# Patient Record
Sex: Female | Born: 1955
Health system: Southern US, Community
[De-identification: ages and names within clinical notes are randomized; demographics above are authoritative.]

## PROBLEM LIST (undated history)

## (undated) DIAGNOSIS — Q828 Other specified congenital malformations of skin: Secondary | ICD-10-CM

## (undated) DIAGNOSIS — L0390 Cellulitis, unspecified: Secondary | ICD-10-CM

## (undated) DIAGNOSIS — N840 Polyp of corpus uteri: Secondary | ICD-10-CM

## (undated) DIAGNOSIS — R9389 Abnormal findings on diagnostic imaging of other specified body structures: Principal | ICD-10-CM

## (undated) DIAGNOSIS — M810 Age-related osteoporosis without current pathological fracture: Secondary | ICD-10-CM

## (undated) DIAGNOSIS — J45909 Unspecified asthma, uncomplicated: Secondary | ICD-10-CM

## (undated) DIAGNOSIS — I10 Essential (primary) hypertension: Secondary | ICD-10-CM

## (undated) DIAGNOSIS — N950 Postmenopausal bleeding: Principal | ICD-10-CM

## (undated) HISTORY — DX: Cellulitis, unspecified: L03.90

## (undated) HISTORY — DX: Essential (primary) hypertension: I10

## (undated) HISTORY — DX: Polyp of corpus uteri: N84.0

## (undated) HISTORY — DX: Age-related osteoporosis without current pathological fracture: M81.0

## (undated) HISTORY — DX: Abnormal findings on diagnostic imaging of other specified body structures: R93.89

## (undated) HISTORY — PX: WISDOM TOOTH EXTRACTION: SHX21

## (undated) HISTORY — DX: Other specified congenital malformations of skin: Q82.8

## (undated) HISTORY — DX: Postmenopausal bleeding: N95.0

## (undated) HISTORY — DX: Unspecified asthma, uncomplicated: J45.909

---

## 2004-04-05 ENCOUNTER — Ambulatory Visit (HOSPITAL_COMMUNITY): Admission: RE | Admit: 2004-04-05 | Discharge: 2004-04-05 | Payer: Self-pay | Admitting: Pulmonary Disease

## 2005-11-25 ENCOUNTER — Emergency Department (HOSPITAL_COMMUNITY): Admission: EM | Admit: 2005-11-25 | Discharge: 2005-11-25 | Payer: Self-pay | Admitting: Emergency Medicine

## 2005-12-08 ENCOUNTER — Ambulatory Visit (HOSPITAL_COMMUNITY): Admission: RE | Admit: 2005-12-08 | Discharge: 2005-12-08 | Payer: Self-pay | Admitting: Pulmonary Disease

## 2007-02-27 ENCOUNTER — Ambulatory Visit (HOSPITAL_COMMUNITY): Admission: RE | Admit: 2007-02-27 | Discharge: 2007-02-27 | Payer: Self-pay | Admitting: Pulmonary Disease

## 2007-10-31 ENCOUNTER — Other Ambulatory Visit: Admission: RE | Admit: 2007-10-31 | Discharge: 2007-10-31 | Payer: Self-pay | Admitting: Obstetrics and Gynecology

## 2007-11-27 ENCOUNTER — Ambulatory Visit (HOSPITAL_COMMUNITY): Admission: RE | Admit: 2007-11-27 | Discharge: 2007-11-27 | Payer: Self-pay | Admitting: Pulmonary Disease

## 2007-11-28 LAB — HM MAMMOGRAPHY

## 2008-08-05 ENCOUNTER — Ambulatory Visit (HOSPITAL_COMMUNITY): Admission: RE | Admit: 2008-08-05 | Discharge: 2008-08-05 | Payer: Self-pay | Admitting: Pulmonary Disease

## 2009-12-16 ENCOUNTER — Ambulatory Visit (HOSPITAL_COMMUNITY)
Admission: RE | Admit: 2009-12-16 | Discharge: 2009-12-16 | Payer: Self-pay | Source: Home / Self Care | Admitting: Pulmonary Disease

## 2010-01-06 ENCOUNTER — Other Ambulatory Visit
Admission: RE | Admit: 2010-01-06 | Discharge: 2010-01-06 | Payer: Self-pay | Source: Home / Self Care | Admitting: Obstetrics and Gynecology

## 2010-01-12 ENCOUNTER — Ambulatory Visit (HOSPITAL_COMMUNITY): Admission: RE | Admit: 2010-01-12 | Discharge: 2010-01-12 | Payer: Self-pay | Admitting: Obstetrics & Gynecology

## 2011-12-14 ENCOUNTER — Other Ambulatory Visit (HOSPITAL_COMMUNITY)
Admission: RE | Admit: 2011-12-14 | Discharge: 2011-12-14 | Disposition: A | Discharge status: Home / Self Care | Payer: 59 | Source: Ambulatory Visit | Attending: Obstetrics and Gynecology | Admitting: Obstetrics and Gynecology

## 2011-12-14 ENCOUNTER — Other Ambulatory Visit: Payer: Self-pay | Admitting: Adult Health

## 2011-12-14 DIAGNOSIS — Z1151 Encounter for screening for human papillomavirus (HPV): Secondary | ICD-10-CM | POA: Insufficient documentation

## 2011-12-14 DIAGNOSIS — Z01419 Encounter for gynecological examination (general) (routine) without abnormal findings: Secondary | ICD-10-CM | POA: Insufficient documentation

## 2012-02-10 ENCOUNTER — Other Ambulatory Visit: Payer: Self-pay | Admitting: Obstetrics and Gynecology

## 2013-06-27 ENCOUNTER — Other Ambulatory Visit (HOSPITAL_COMMUNITY): Payer: Self-pay | Admitting: Pulmonary Disease

## 2013-06-27 DIAGNOSIS — R200 Anesthesia of skin: Secondary | ICD-10-CM

## 2013-06-28 ENCOUNTER — Ambulatory Visit (HOSPITAL_COMMUNITY)
Admission: RE | Admit: 2013-06-28 | Discharge: 2013-06-28 | Disposition: A | Discharge status: Home / Self Care | Payer: 59 | Source: Ambulatory Visit | Attending: Pulmonary Disease | Admitting: Pulmonary Disease

## 2013-06-28 DIAGNOSIS — R200 Anesthesia of skin: Secondary | ICD-10-CM

## 2013-06-28 DIAGNOSIS — R209 Unspecified disturbances of skin sensation: Secondary | ICD-10-CM | POA: Insufficient documentation

## 2013-07-05 ENCOUNTER — Other Ambulatory Visit (HOSPITAL_COMMUNITY): Payer: Self-pay | Admitting: Pulmonary Disease

## 2013-07-05 DIAGNOSIS — G35 Multiple sclerosis: Secondary | ICD-10-CM

## 2013-07-12 ENCOUNTER — Ambulatory Visit (HOSPITAL_COMMUNITY): Payer: 59

## 2014-11-18 ENCOUNTER — Encounter: Payer: Self-pay | Admitting: Adult Health

## 2014-11-18 ENCOUNTER — Ambulatory Visit (INDEPENDENT_AMBULATORY_CARE_PROVIDER_SITE_OTHER): Payer: 59 | Admitting: Adult Health

## 2014-11-18 VITALS — BP 150/90 | HR 76 | Ht 67.0 in | Wt 239.5 lb

## 2014-11-18 DIAGNOSIS — N950 Postmenopausal bleeding: Secondary | ICD-10-CM | POA: Diagnosis not present

## 2014-11-18 HISTORY — DX: Postmenopausal bleeding: N95.0

## 2014-11-18 NOTE — Patient Instructions (Signed)
Postmenopausal Bleeding Postmenopausal bleeding is any bleeding a woman has after she has entered into menopause. Menopause is the end of a woman's fertile years. After menopause, a woman no longer ovulates or has menstrual periods.  Postmenopausal bleeding can be caused by various things. Any type of postmenopausal bleeding, even if it appears to be a typical menstrual period, is concerning. This should be evaluated by your health care provider. Any treatment will depend on the cause of the bleeding. HOME CARE INSTRUCTIONS Monitor your condition for any changes. The following actions may help to alleviate any discomfort you are experiencing:  Avoid the use of tampons and douches as directed by your health care provider.  Change your pads frequently.  Get regular pelvic exams and Pap tests.  Keep all follow-up appointments for diagnostic tests as directed by your health care provider. SEEK MEDICAL CARE IF:   Your bleeding lasts more than 1 week.  You have abdominal pain.  You have bleeding with sexual intercourse. SEEK IMMEDIATE MEDICAL CARE IF:   You have a fever, chills, headache, dizziness, muscle aches, and bleeding.  You have severe pain with bleeding.  You are passing blood clots.  You have bleeding and need more than 1 pad an hour.  You feel faint. MAKE SURE YOU:  Understand these instructions.  Will watch your condition.  Will get help right away if you are not doing well or get worse. Document Released: 05/11/2005 Document Revised: 11/21/2012 Document Reviewed: 08/30/2012 [facility] Patient Information 2015 [place], [place]. This information is not intended to replace advice given to you by your health care provider. Make sure you discuss any questions you have with your health care provider. Return in 1 day for [place] 11/1 for pap and physical with me

## 2014-11-18 NOTE — Progress Notes (Signed)
Subjective:     Patient ID: Tonya Nelson, female   DOB: 08-12-1955, 59 y.o.   MRN: [id number]  HPI Tonya Nelson is a 59 year old white female, widowed in complaining of vaginal bleeding for 2 weeks, has clots no pain.Has not taken BP meds today.Has new sex partner and is seeing Dr Luan Pulling for work up of MS symptoms, has balance problems and legs hurt and hands and legs feel numb.Has increased urination and "MS hug" feeling.  Review of Systems Patient denies any headaches, hearing loss, fatigue, blurred vision, shortness of breath, chest pain, abdominal pain, problems with bowel movements, urination, or intercourse. No joint pain or mood swings.See HPI for positives.  Reviewed past medical,surgical, social and family history. Reviewed medications and allergies.     Objective:   Physical Exam BP 150/90 mmHg  Pulse 76  Ht 5\' 7"  (1.702 m)  Wt 239 lb 8 oz (108.636 kg)  BMI 37.50 kg/m2   Skin warm and dry.Pelvic: external genitalia is normal in appearance no lesions, vagina: period like blood,urethra has no lesions or masses noted, cervix:smooth and bulbous, uterus: normal size, shape and contour, non tender, no masses felt, adnexa: no masses or tenderness noted. Bladder is non tender and no masses felt. GC/CHL obtained.  Assessment:     PMB    Plan:    GC/CHL sent Return in 1 day for gyn [place] Return 11/1 for pap and physical an fasting labs Review handout on PMB

## 2014-11-19 ENCOUNTER — Telehealth: Payer: Self-pay | Admitting: Adult Health

## 2014-11-19 ENCOUNTER — Ambulatory Visit (INDEPENDENT_AMBULATORY_CARE_PROVIDER_SITE_OTHER): Payer: 59

## 2014-11-19 DIAGNOSIS — N950 Postmenopausal bleeding: Secondary | ICD-10-CM | POA: Diagnosis not present

## 2014-11-19 LAB — GC/CHLAMYDIA PROBE AMP
CHLAMYDIA, DNA PROBE: NEGATIVE
NEISSERIA GONORRHOEAE BY PCR: NEGATIVE

## 2014-11-19 MED ORDER — NAPROXEN SODIUM 550 MG PO TABS: 550.0000 mg | ORAL_TABLET | Freq: Two times a day (BID) | ORAL | Status: DC

## 2014-11-19 NOTE — Telephone Encounter (Signed)
Pt aware US shows 7.48 mm endometrium will get endo biopsy with Dr Glo Herring, and will rx anaprox ds for cramps, increase fluids

## 2014-11-19 NOTE — Progress Notes (Signed)
PELVIC US TA/TV:heterogenous, anteverted uterus w/a 2.2 x 1.7 x 1.8cm submucosal fundal fibroid that is distorting the fundal portion of the endometrium,thickened EEC 7.30mm,normal ov's bilat (mobile),no pain or free fluid

## 2014-11-28 ENCOUNTER — Ambulatory Visit (INDEPENDENT_AMBULATORY_CARE_PROVIDER_SITE_OTHER): Payer: 59 | Admitting: Obstetrics and Gynecology

## 2014-11-28 ENCOUNTER — Other Ambulatory Visit: Payer: Self-pay | Admitting: Obstetrics and Gynecology

## 2014-11-28 VITALS — BP 130/82 | Ht 67.0 in | Wt 238.0 lb

## 2014-11-28 DIAGNOSIS — N950 Postmenopausal bleeding: Secondary | ICD-10-CM | POA: Diagnosis not present

## 2014-11-28 DIAGNOSIS — N840 Polyp of corpus uteri: Secondary | ICD-10-CM | POA: Diagnosis not present

## 2014-11-28 NOTE — Progress Notes (Signed)
Patient ID: Tonya Nelson, female   DOB: [birth year], 59 y.o.   MRN: [id number] Pt here today for Endometrial biopsy.

## 2014-11-28 NOTE — Progress Notes (Signed)
Patient ID: Tonya Nelson, female   DOB: 11-07-1955, 59 y.o.   MRN: [id number]  Pt is having an endometrial biopsy for evaluation of post menopausal bleeding. Pt had an US done on 11/18/13. She states she has had an episode similar to this in the past  Endometrial Biopsy: Patient given informed consent, signed copy in the chart, time out was performed. Time out taken. The patient was placed in the lithotomy position and the cervix brought into view with sterile speculum.  Portio of cervix cleansed x 2 with betadine swabs.  A tenaculum was placed in the anterior lip of the cervix. The uterus was sounded for depth of 8 cm Milex uterine Explora 3 mm was introduced to into the uterus, suction created,  and an endometrial sample was obtained. All equipment was removed and accounted for.   The patient tolerated the procedure well.    Patient given post procedure instructions.  Followup: F/u results by phone, 1 week   By signing my name below, I, Erling Conte, attest that this documentation has been prepared under the direction and in the presence of Jonnie Kind, MD. Electronically Signed: Erling Conte, ED Scribe. 11/28/2014. 12:31 PM.  I personally performed the services described in this documentation, which was SCRIBED in my presence. The recorded information has been reviewed and considered accurate. It has been edited as necessary during review. Jonnie Kind, MD

## 2014-12-03 ENCOUNTER — Telehealth: Payer: Self-pay | Admitting: Obstetrics and Gynecology

## 2014-12-03 ENCOUNTER — Telehealth: Payer: Self-pay | Admitting: Adult Health

## 2014-12-03 NOTE — Telephone Encounter (Signed)
Left message no results back yet on endo biopsy

## 2014-12-03 NOTE — Telephone Encounter (Signed)
Pt stopped by the office and results were given to the pt.

## 2014-12-05 ENCOUNTER — Encounter: Payer: Self-pay | Admitting: Obstetrics and Gynecology

## 2014-12-05 ENCOUNTER — Ambulatory Visit (INDEPENDENT_AMBULATORY_CARE_PROVIDER_SITE_OTHER): Payer: Self-pay | Admitting: Obstetrics and Gynecology

## 2014-12-05 VITALS — BP 120/72 | Ht 67.0 in | Wt 243.0 lb

## 2014-12-05 DIAGNOSIS — N950 Postmenopausal bleeding: Secondary | ICD-10-CM

## 2014-12-05 NOTE — Progress Notes (Signed)
Patient ID: Tonya Nelson, female   DOB: 19-Mar-1955, 59 y.o.   MRN: [id number] Pt here today for results and to discuss what's next.

## 2014-12-05 NOTE — Progress Notes (Deleted)
°   [facility] Visit  Patient name: Tonya Nelson MRN [id number]  Date of birth: [birth year]  CC & HPI:  Tonya Nelson is a 59 y.o. female presenting today for discussion of endometrial biopsy results. She reports mild continued vaginal bleeding (spotting).  ROS:  A complete review of systems was obtained and all systems are negative except as noted in the HPI and PMH.    Pertinent History Reviewed:   Reviewed: Significant for postmenopausal bleeding Medical         Past Medical History  Diagnosis Date   Hypertension    Osteoporosis    PMB (postmenopausal bleeding) 11/18/2014                              Surgical Hx:    Past Surgical History  Procedure Laterality Date   Wisdom tooth extraction     Medications: Reviewed & Updated - see associated section                       Current outpatient prescriptions:    bisoprolol-hydrochlorothiazide (ZIAC) 10-6.25 MG tablet, Take 1 tablet by mouth daily., Disp: , Rfl:    naproxen sodium (ANAPROX) 550 MG tablet, Take 1 tablet (550 mg total) by mouth 2 (two) times daily with a meal., Disp: 30 tablet, Rfl: 1   triamcinolone cream (KENALOG) 0.1 %, APPLY TO AFFECTED AREAS UP TO 2 TIMES PER DAY; AVOID FACE, GROIN, UNDERARM, Disp: , Rfl: 0   Vitamin D, Ergocalciferol, (DRISDOL) 50000 UNITS CAPS capsule, Take 50,000 Units by mouth daily., Disp: , Rfl:    Social History: Reviewed -  reports that she has never smoked. She has never used smokeless tobacco.  Objective Findings:  Vitals: Blood pressure 120/72, height 5\' 7"  (1.702 m), weight 243 lb (110.224 kg).  Physical Examination:  Pt here for discussion only.    Assessment & Plan:   A:  1. Endometrial bx results still not available. Specimens were not picked up last week.   P:  1. Apologized to patient. Results available by phone next week. Pt verbalized understanding and agreed to plan.       By signing my name below, I, Stephania Fragmin, attest that this documentation  has been prepared under the direction and in the presence of Jonnie Kind, MD. Electronically Signed: Stephania Fragmin, ED Scribe. 12/05/2014. 11:32 AM.   I personally performed the services described in this documentation, which was SCRIBED in my presence. The recorded information has been reviewed and considered accurate. It has been edited as necessary during review. Jonnie Kind, MD   (scribe attestation statement)

## 2014-12-07 NOTE — Progress Notes (Signed)
Patient ID: Tonya Nelson, female   DOB: [birth year], 59 y.o.   MRN: [id number]   [facility] Visit  Patient name: Tonya Nelson MRN [id number]  Date of birth: [birth year]  CC & HPI:  Tonya Nelson is a 59 y.o. female presenting today for discussion of endometrial biopsy results. She reports mild continued vaginal bleeding (spotting).  ROS:  A complete review of systems was obtained and all systems are negative except as noted in the HPI and PMH.    Pertinent History Reviewed:   Reviewed: Significant for postmenopausal bleeding Medical         Past Medical History  Diagnosis Date  . Hypertension   . Osteoporosis   . PMB (postmenopausal bleeding) 11/18/2014                              Surgical Hx:    Past Surgical History  Procedure Laterality Date  . Wisdom tooth extraction     Medications: Reviewed & Updated - see associated section                       Current outpatient prescriptions:  .  bisoprolol-hydrochlorothiazide (ZIAC) 10-6.25 MG tablet, Take 1 tablet by mouth daily., Disp: , Rfl:  .  naproxen sodium (ANAPROX) 550 MG tablet, Take 1 tablet (550 mg total) by mouth 2 (two) times daily with a meal., Disp: 30 tablet, Rfl: 1 .  triamcinolone cream (KENALOG) 0.1 %, APPLY TO AFFECTED AREAS UP TO 2 TIMES PER DAY; AVOID FACE, GROIN, UNDERARM, Disp: , Rfl: 0 .  Vitamin D, Ergocalciferol, (DRISDOL) 50000 UNITS CAPS capsule, Take 50,000 Units by mouth daily., Disp: , Rfl:    Social History: Reviewed -  reports that she has never smoked. She has never used smokeless tobacco.  Objective Findings:  Vitals: Blood pressure 120/72, height 5\' 7"  (1.702 m), weight 243 lb (110.224 kg).  Physical Examination:  Pt here for discussion only.    Assessment & Plan:   A:  1. Endometrial bx results still not available. Specimens were not picked up last week.   P:  1. Apologized to patient. Results available by phone next week. Pt verbalized understanding and agreed to plan.        By signing my name below, I, Stephania Fragmin, attest that this documentation has been prepared under the direction and in the presence of Jonnie Kind, MD. Electronically Signed: Stephania Fragmin, ED Scribe. 12/05/2014. 11:32 AM.   I personally performed the services described in this documentation, which was SCRIBED in my presence. The recorded information has been reviewed and considered accurate. It has been edited as necessary during review. Jonnie Kind, MD   (scribe attestation statement)

## 2014-12-08 ENCOUNTER — Telehealth: Payer: Self-pay | Admitting: Obstetrics and Gynecology

## 2014-12-08 NOTE — Telephone Encounter (Signed)
Pt aware of biopsy results  

## 2014-12-16 ENCOUNTER — Ambulatory Visit: Payer: 59 | Admitting: Adult Health

## 2014-12-16 DIAGNOSIS — N840 Polyp of corpus uteri: Secondary | ICD-10-CM

## 2014-12-16 HISTORY — DX: Polyp of corpus uteri: N84.0

## 2014-12-17 ENCOUNTER — Ambulatory Visit: Payer: Self-pay | Admitting: Adult Health

## 2014-12-25 ENCOUNTER — Ambulatory Visit (INDEPENDENT_AMBULATORY_CARE_PROVIDER_SITE_OTHER): Payer: 59 | Admitting: Adult Health

## 2014-12-25 ENCOUNTER — Other Ambulatory Visit (HOSPITAL_COMMUNITY)
Admission: RE | Admit: 2014-12-25 | Discharge: 2014-12-25 | Disposition: A | Discharge status: Home / Self Care | Payer: Self-pay | Source: Ambulatory Visit | Attending: Adult Health | Admitting: Adult Health

## 2014-12-25 ENCOUNTER — Encounter: Payer: Self-pay | Admitting: Adult Health

## 2014-12-25 VITALS — BP 128/90 | HR 64 | Ht 67.0 in | Wt 240.5 lb

## 2014-12-25 DIAGNOSIS — Z1151 Encounter for screening for human papillomavirus (HPV): Secondary | ICD-10-CM | POA: Insufficient documentation

## 2014-12-25 DIAGNOSIS — Z01419 Encounter for gynecological examination (general) (routine) without abnormal findings: Secondary | ICD-10-CM | POA: Insufficient documentation

## 2014-12-25 DIAGNOSIS — Z1212 Encounter for screening for malignant neoplasm of rectum: Secondary | ICD-10-CM

## 2014-12-25 LAB — HEMOCCULT GUIAC POC 1CARD (OFFICE): FECAL OCCULT BLD: NEGATIVE

## 2014-12-25 NOTE — Progress Notes (Signed)
Patient ID: Tonya Nelson, female   DOB: 1955/10/23, 59 y.o.   MRN: [id number] History of Present Illness: Tonya Nelson is a 59 year old white female,widowed,in for a well woman gyn exam and pap.She has had PMB recently and had endometrial biopsy that showed benign polyp. She says the bleeding has stopped. PCP is Dr Luan Pulling.  Current Medications, Allergies, Past Medical History, Past Surgical History, Family History and Social History were reviewed in [employer] record.     Review of Systems: Patient denies any headaches, hearing loss, fatigue, blurred vision, shortness of breath, chest pain, abdominal pain, problems with bowel movements, urination, or intercourse. No joint pain or mood swings.See HPI for positives.    Physical Exam:BP 128/90 mmHg  Pulse 64  Ht 5\' 7"  (1.702 m)  Wt 240 lb 8 oz (109.09 kg)  BMI 37.66 kg/m2 General:  Well developed, well nourished, no acute distress Skin:  Warm and dry Neck:  Midline trachea, normal thyroid, good ROM, no lymphadenopathy Lungs; Clear to auscultation bilaterally Breast:  No dominant palpable mass, retraction, or nipple discharge Cardiovascular: Regular rate and rhythm Abdomen:  Soft, non tender, no hepatosplenomegaly,has dry patch of skin on right side of waist Pelvic:  External genitalia is normal in appearance, no lesions.  The vagina has decreased color, moisture and rugae. Urethra has no lesions or masses. The cervix is smooth,pap with HPV performed.  Uterus is felt to be normal size, shape, and contour.  No adnexal masses or tenderness noted.Bladder is non tender, no masses felt. Rectal: Good sphincter tone, no polyps,  hemorrhoids felt.  Hemoccult negative. Extremities/musculoskeletal:  No swelling or varicosities noted, no clubbing or cyanosis Psych:  No mood changes, alert and cooperative,seems happy   Impression: Well woman gyn exam with pap   Plan: If has any more bleeding call Physical in 1 year Mammogram  now(past due) and yearly Colonoscopy advised Declines flu shot  Check CBC,CMP,TSH and lipids Use kenalog on dry skin patch 2-3 x daily as needed,she has some at home

## 2014-12-25 NOTE — Patient Instructions (Signed)
Physical in  1 year Mammogram now and yearly  [phone] Colonoscopy advised

## 2014-12-29 LAB — CYTOLOGY - PAP

## 2014-12-30 ENCOUNTER — Telehealth: Payer: Self-pay | Admitting: Adult Health

## 2014-12-30 NOTE — Telephone Encounter (Signed)
Pt aware of pap,did not get labs due to insurance problem

## 2015-01-14 LAB — CBC
HEMOGLOBIN: 13.8 g/dL (ref 11.1–15.9)
Hematocrit: 41.3 % (ref 34.0–46.6)
MCH: 28.8 pg (ref 26.6–33.0)
MCHC: 33.4 g/dL (ref 31.5–35.7)
MCV: 86 fL (ref 79–97)
PLATELETS: 224 10*3/uL (ref 150–379)
RBC: 4.79 x10E6/uL (ref 3.77–5.28)
RDW: 13.8 % (ref 12.3–15.4)
WBC: 6.8 10*3/uL (ref 3.4–10.8)

## 2015-01-14 LAB — COMPREHENSIVE METABOLIC PANEL
ALBUMIN: 4 g/dL (ref 3.6–4.8)
ALK PHOS: 40 IU/L (ref 39–117)
ALT: 17 IU/L (ref 0–32)
AST: 17 IU/L (ref 0–40)
Albumin/Globulin Ratio: 1.4 (ref 1.1–2.5)
BILIRUBIN TOTAL: 0.5 mg/dL (ref 0.0–1.2)
BUN/Creatinine Ratio: 16 (ref 11–26)
BUN: 13 mg/dL (ref 8–27)
CHLORIDE: 102 mmol/L (ref 97–106)
CO2: 26 mmol/L (ref 18–29)
CREATININE: 0.79 mg/dL (ref 0.57–1.00)
Calcium: 9.7 mg/dL (ref 8.7–10.3)
GFR calc Af Amer: 94 mL/min/{1.73_m2} (ref >59)
GFR calc non Af Amer: 82 mL/min/{1.73_m2} (ref >59)
GLUCOSE: 100 mg/dL — AB (ref 65–99)
Globulin, Total: 2.9 g/dL (ref 1.5–4.5)
Potassium: 4.4 mmol/L (ref 3.5–5.2)
Sodium: 143 mmol/L (ref 136–144)
TOTAL PROTEIN: 6.9 g/dL (ref 6.0–8.5)

## 2015-01-14 LAB — LIPID PANEL
Chol/HDL Ratio: 3.6 ratio units (ref 0.0–4.4)
Cholesterol, Total: 159 mg/dL (ref 100–199)
HDL: 44 mg/dL (ref >39)
LDL Calculated: 93 mg/dL (ref 0–99)
TRIGLYCERIDES: 109 mg/dL (ref 0–149)
VLDL CHOLESTEROL CAL: 22 mg/dL (ref 5–40)

## 2015-01-14 LAB — TSH: TSH: 0.985 u[IU]/mL (ref 0.450–4.500)

## 2015-01-16 ENCOUNTER — Telehealth: Payer: Self-pay | Admitting: Adult Health

## 2015-01-16 NOTE — Telephone Encounter (Signed)
Pt aware labs good 

## 2015-09-18 DIAGNOSIS — R609 Edema, unspecified: Secondary | ICD-10-CM | POA: Diagnosis not present

## 2015-09-29 ENCOUNTER — Other Ambulatory Visit (HOSPITAL_COMMUNITY): Payer: Self-pay | Admitting: Pulmonary Disease

## 2015-09-29 DIAGNOSIS — R600 Localized edema: Secondary | ICD-10-CM | POA: Diagnosis not present

## 2015-09-29 DIAGNOSIS — M7989 Other specified soft tissue disorders: Secondary | ICD-10-CM

## 2015-09-29 DIAGNOSIS — L03032 Cellulitis of left toe: Secondary | ICD-10-CM | POA: Diagnosis not present

## 2015-09-29 DIAGNOSIS — J45909 Unspecified asthma, uncomplicated: Secondary | ICD-10-CM | POA: Diagnosis not present

## 2015-09-29 DIAGNOSIS — R609 Edema, unspecified: Secondary | ICD-10-CM

## 2015-09-29 DIAGNOSIS — I10 Essential (primary) hypertension: Secondary | ICD-10-CM | POA: Diagnosis not present

## 2015-10-05 DIAGNOSIS — J45909 Unspecified asthma, uncomplicated: Secondary | ICD-10-CM | POA: Diagnosis not present

## 2015-10-05 DIAGNOSIS — R600 Localized edema: Secondary | ICD-10-CM | POA: Diagnosis not present

## 2015-10-05 DIAGNOSIS — L03032 Cellulitis of left toe: Secondary | ICD-10-CM | POA: Diagnosis not present

## 2015-10-05 DIAGNOSIS — I10 Essential (primary) hypertension: Secondary | ICD-10-CM | POA: Diagnosis not present

## 2015-10-05 DIAGNOSIS — R7309 Other abnormal glucose: Secondary | ICD-10-CM | POA: Diagnosis not present

## 2015-10-05 LAB — COMPREHENSIVE METABOLIC PANEL
Albumin: 3.9 (ref 3.5–5.0)
Calcium: 9.2 (ref 8.7–10.7)

## 2015-10-05 LAB — CBC AND DIFFERENTIAL
HCT: 37 (ref 36–46)
Hemoglobin: 12.2 (ref 12.0–16.0)
Neutrophils Absolute: 3416
Platelets: 192 (ref 150–399)
WBC: 6.1

## 2015-10-05 LAB — BASIC METABOLIC PANEL
BUN: 12 (ref 4–21)
CO2: 29 — AB (ref 13–22)
Chloride: 103 (ref 99–108)
Creatinine: 0.8 (ref <=1.1)
Glucose: 102
Potassium: 3.7 (ref 3.4–5.3)
Sodium: 141 (ref 137–147)

## 2015-10-05 LAB — HEPATIC FUNCTION PANEL
ALT: 28 (ref 7–35)
AST: 31 (ref 13–35)
Alkaline Phosphatase: 35 (ref 25–125)

## 2015-10-05 LAB — CBC: RBC: 4.28 (ref 3.87–5.11)

## 2015-10-07 DIAGNOSIS — L03119 Cellulitis of unspecified part of limb: Secondary | ICD-10-CM | POA: Diagnosis not present

## 2015-10-07 LAB — TSH: TSH: 1.36 (ref <=5.90)

## 2015-10-07 LAB — HEMOGLOBIN A1C: Hemoglobin A1C: 5.3

## 2015-10-08 ENCOUNTER — Ambulatory Visit (HOSPITAL_COMMUNITY)
Admission: RE | Admit: 2015-10-08 | Discharge: 2015-10-08 | Disposition: A | Discharge status: Home / Self Care | Payer: BLUE CROSS/BLUE SHIELD | Source: Ambulatory Visit | Attending: Vascular Surgery | Admitting: Vascular Surgery

## 2015-10-08 ENCOUNTER — Ambulatory Visit (HOSPITAL_COMMUNITY)
Admission: RE | Admit: 2015-10-08 | Discharge: 2015-10-08 | Disposition: A | Discharge status: Home / Self Care | Payer: BLUE CROSS/BLUE SHIELD | Source: Ambulatory Visit | Attending: Pulmonary Disease | Admitting: Pulmonary Disease

## 2015-10-08 ENCOUNTER — Encounter (HOSPITAL_COMMUNITY): Payer: Self-pay

## 2015-10-08 DIAGNOSIS — I517 Cardiomegaly: Secondary | ICD-10-CM | POA: Insufficient documentation

## 2015-10-08 DIAGNOSIS — I059 Rheumatic mitral valve disease, unspecified: Secondary | ICD-10-CM | POA: Diagnosis not present

## 2015-10-08 DIAGNOSIS — M7989 Other specified soft tissue disorders: Secondary | ICD-10-CM

## 2015-10-08 DIAGNOSIS — R609 Edema, unspecified: Secondary | ICD-10-CM | POA: Diagnosis not present

## 2015-10-08 LAB — ECHOCARDIOGRAM COMPLETE
CHL CUP DOP CALC LVOT VTI: 28.3 cm
CHL CUP RV SYS PRESS: 31 mmHg
CHL CUP STROKE VOLUME: 45 mL
EERAT: 9.88
EWDT: 240 ms
FS: 37 % (ref 28–44)
IVS/LV PW RATIO, ED: 0.92
LA ID, A-P, ES: 37 mm
LADIAMINDEX: 1.6 cm/m2
LAVOL: 49.6 mL
LAVOLA4C: 45.9 mL
LAVOLIN: 21.4 mL/m2
LEFT ATRIUM END SYS DIAM: 37 mm
LV E/e' medial: 9.88
LV PW d: 11.1 mm — AB (ref 0.6–1.1)
LV SIMPSON'S DISK: 69
LV TDI E'MEDIAL: 5.87
LV dias vol index: 28 mL/m2
LV dias vol: 65 mL (ref 46–106)
LV e' LATERAL: 8.49 cm/s
LVEEAVG: 9.88
LVOT area: 2.84 cm2
LVOT peak grad rest: 5 mmHg
LVOT peak vel: 110 cm/s
LVOTD: 19 mm
LVOTSV: 80 mL
LVSYSVOL: 20 mL (ref 14–42)
LVSYSVOLIN: 9 mL/m2
Lateral S' vel: 12.3 cm/s
MV Dec: 240
MV Peak grad: 3 mmHg
MV pk A vel: 131 m/s
MVPKEVEL: 83.9 m/s
RV TAPSE: 31.7 mm
Reg peak vel: 263 cm/s
TDI e' lateral: 8.49
TRMAXVEL: 263 cm/s

## 2015-10-08 NOTE — Progress Notes (Signed)
*  PRELIMINARY RESULTS* Echocardiogram 2D Echocardiogram has been performed.  Tonya Nelson 10/08/2015, 8:59 AM

## 2015-10-08 NOTE — Progress Notes (Signed)
*  PRELIMINARY RESULTS* Echocardiogram 2D Echocardiogram has been performed.  Tonya Nelson 10/08/2015, 9:17 AM

## 2015-10-15 DIAGNOSIS — J45909 Unspecified asthma, uncomplicated: Secondary | ICD-10-CM | POA: Diagnosis not present

## 2015-10-15 DIAGNOSIS — L03032 Cellulitis of left toe: Secondary | ICD-10-CM | POA: Diagnosis not present

## 2015-10-15 DIAGNOSIS — R600 Localized edema: Secondary | ICD-10-CM | POA: Diagnosis not present

## 2015-10-15 DIAGNOSIS — I10 Essential (primary) hypertension: Secondary | ICD-10-CM | POA: Diagnosis not present

## 2015-10-22 ENCOUNTER — Ambulatory Visit (INDEPENDENT_AMBULATORY_CARE_PROVIDER_SITE_OTHER): Payer: BLUE CROSS/BLUE SHIELD | Admitting: Cardiology

## 2015-10-22 ENCOUNTER — Encounter: Payer: Self-pay | Admitting: Cardiology

## 2015-10-22 VITALS — BP 144/74 | HR 79 | Ht 67.0 in | Wt 245.0 lb

## 2015-10-22 DIAGNOSIS — I5189 Other ill-defined heart diseases: Secondary | ICD-10-CM

## 2015-10-22 DIAGNOSIS — M7989 Other specified soft tissue disorders: Secondary | ICD-10-CM | POA: Diagnosis not present

## 2015-10-22 DIAGNOSIS — I890 Lymphedema, not elsewhere classified: Secondary | ICD-10-CM | POA: Diagnosis not present

## 2015-10-22 DIAGNOSIS — I10 Essential (primary) hypertension: Secondary | ICD-10-CM

## 2015-10-22 DIAGNOSIS — I519 Heart disease, unspecified: Secondary | ICD-10-CM | POA: Diagnosis not present

## 2015-10-22 MED ORDER — FUROSEMIDE 80 MG PO TABS: 80.0000 mg | ORAL_TABLET | Freq: Every day | ORAL | 3 refills | Status: DC

## 2015-10-22 MED ORDER — POTASSIUM CHLORIDE CRYS ER 20 MEQ PO TBCR: 40.0000 meq | EXTENDED_RELEASE_TABLET | Freq: Every day | ORAL | 3 refills | Status: DC

## 2015-10-22 NOTE — Progress Notes (Signed)
Cardiology Office Note  Date: 10/22/2015   ID: Tonya Nelson, DOB Sep 26, 1955, MRN [id number]  PCP: Alonza Bogus, MD  Consulting Cardiologist: Rozann Lesches, MD   Chief Complaint  Patient presents with  . Leg Swelling    History of Present Illness: Tonya Nelson is a 60 y.o. female referred for cardiology consultation by Dr. Luan Pulling. She is referred with leg edema that has been present over the last several weeks, also recent cellulitis of her left fifth toe region, status post a few courses of antibiotics at this point. She states that the cellulitis seems to be getting better, she also had a place on her left arm as well. She has leg edema at baseline, but reports that it has been worse during this time. She is not reporting any chest pain, orthopnea, or PND.  I reviewed her echocardiogram, report is outlined below. She has preserved systolic function and only mild diastolic dysfunction. Leg edema seems to be out of proportion to this. Blood pressure has also been recently well-controlled over time by report and she is on Ziac. I reviewed her ECG today which shows normal sinus rhythm.  She has been taking Lasix 40 mg daily with potassium supplements. This has not led to substantial improvement in leg edema as yet. She states that she was on a diuretic in the past as well.  Past Medical History:  Diagnosis Date  . Cellulitis    in toe on left foot  . Endometrial polyp 12/2014  . Essential hypertension   . Osteoporosis   . PMB (postmenopausal bleeding) 11/18/2014    Past Surgical History:  Procedure Laterality Date  . WISDOM TOOTH EXTRACTION      Current Outpatient Prescriptions  Medication Sig Dispense Refill  . bisoprolol-hydrochlorothiazide (ZIAC) 10-6.25 MG tablet Take 1 tablet by mouth daily.    Marland Kitchen doxycycline (VIBRAMYCIN) 100 MG capsule Take 100 mg by mouth 2 (two) times daily.  0  . naproxen sodium (ANAPROX) 550 MG tablet Take 1 tablet (550 mg total) by mouth 2  (two) times daily with a meal. (Patient taking differently: Take 550 mg by mouth daily. ) 30 tablet 1  . triamcinolone cream (KENALOG) 0.1 % APPLY TO AFFECTED AREAS UP TO 2 TIMES PER DAY; AVOID FACE, GROIN, UNDERARM  0  . Vitamin D, Ergocalciferol, (DRISDOL) 50000 UNITS CAPS capsule Take 50,000 Units by mouth daily.     No current facility-administered medications for this visit.    Allergies:  Penicillins   Social History: The patient  reports that she has never smoked. She has never used smokeless tobacco. She reports that she does not drink alcohol or use drugs.   Family History: The patient's family history includes Bone cancer in her father; Diabetes in her maternal grandmother; Glaucoma in her maternal grandfather; Osteoporosis in her mother; Parkinson's disease in her brother; Stomach cancer in her mother.   ROS:  Please see the history of present illness. Otherwise, complete review of systems is positive for bilateral leg edema.  All other systems are reviewed and negative.   Physical Exam: VS:  BP (!) 144/74   Pulse 79   Ht 5\' 7"  (1.702 m)   Wt 245 lb (111.1 kg)   SpO2 98%   BMI 38.37 kg/m , BMI Body mass index is 38.37 kg/m.  Wt Readings from Last 3 Encounters:  10/22/15 245 lb (111.1 kg)  12/25/14 240 lb 8 oz (109.1 kg)  12/05/14 243 lb (110.2 kg)  General: Obese woman, appears comfortable at rest. HEENT: Conjunctiva and lids normal, oropharynx clear. Neck: Supple, no elevated JVP or carotid bruits, no thyromegaly. Lungs: Clear to auscultation, nonlabored breathing at rest. Cardiac: Regular rate and rhythm, no S3 or significant systolic murmur, no pericardial rub. Abdomen: Soft, nontender, bowel sounds present, no guarding or rebound. Extremities: Moderate bilateral lower leg edema and lymphedema, distal pulses 1-2+. Skin: Warm and dry. Mall area of erythema left fifth toe but nontender. Musculoskeletal: No kyphosis. Neuropsychiatric: Alert and oriented x3, affect  grossly appropriate.  ECG: No old tracing for comparison.  Recent Labwork: 01/13/2015: ALT 17; AST 17; BUN 13; Creatinine, Ser 0.79; Platelets 224; Potassium 4.4; Sodium 143; TSH 0.985     Component Value Date/Time   CHOL 159 01/13/2015 0907   TRIG 109 01/13/2015 0907   HDL 44 01/13/2015 0907   CHOLHDL 3.6 01/13/2015 0907   LDLCALC 93 01/13/2015 0907    Other Studies Reviewed Today:  Echocardiogram 10/08/2015: Study Conclusions  - Left ventricle: The cavity size was mildly dilated. Wall   thickness was normal. Systolic function was normal. The estimated   ejection fraction was in the range of 55% to 60%. Wall motion was   normal; there were no regional wall motion abnormalities. Doppler   parameters are consistent with abnormal left ventricular   relaxation (grade 1 diastolic dysfunction). - Mitral valve: Calcified annulus. - Atrial septum: No defect or patent foramen ovale was identified. - Pulmonary arteries: PA peak pressure: 31 mm Hg (S).  Lower extremity venous Dopplers 10/08/2015: FINDINGS: There is complete compressibility of the bilateral common femoral, femoral, and popliteal veins. Doppler analysis demonstrates respiratory phasicity and augmentation of flow with calf compression. No obvious superficial vein or calf vein thrombosis.  IMPRESSION: No evidence of DVT in the lower extremities.  Assessment and Plan:  1. Bilateral leg edema and lymphedema on examination. Seems to be out of proportion to her recent echocardiogram which shows preserved LVEF and only mild diastolic dysfunction. She has been on Ziac at baseline. Agree with use of Lasix, I asked her to double the dose along with her potassium supplements over the next week to see if this was more effective. If so she should check back with Dr. Luan Pulling regarding arranging a higher standing diuretic dose. If she is able to get her leg edema back to baseline, might be a good situation to use compression  stockings.  2. Essential hypertension, on Ziac.  3. Recent left fifth toe cellulitis, improving on antibiotics. No definite history of PAD or claudication.  Current medicines were reviewed with the patient today.   Orders Placed This Encounter  Procedures  . EKG 12-Lead    Disposition: Follow-up with Dr. Luan Pulling, we can see her back if needed.  Signed, Satira Sark, MD, [facility] 10/22/2015 11:39 AM    [facility]. [address] [place], [place] [address] Phone: [phone]; Fax: [phone]

## 2015-10-22 NOTE — Patient Instructions (Addendum)
Your physician recommends that you schedule a follow-up appointment in: as needed    INCREASE Lasix to 80 mg daily   INCREASE Potassium to 40 meq daily   Follow up with Dr Luan Pulling regarding your medication doses of lasix and potasium regarding your leg swelling       Thank you for choosing [facility] !

## 2015-12-15 DIAGNOSIS — J45909 Unspecified asthma, uncomplicated: Secondary | ICD-10-CM | POA: Diagnosis not present

## 2015-12-15 DIAGNOSIS — R600 Localized edema: Secondary | ICD-10-CM | POA: Diagnosis not present

## 2015-12-15 DIAGNOSIS — L03032 Cellulitis of left toe: Secondary | ICD-10-CM | POA: Diagnosis not present

## 2015-12-15 DIAGNOSIS — I10 Essential (primary) hypertension: Secondary | ICD-10-CM | POA: Diagnosis not present

## 2016-06-20 DIAGNOSIS — M79674 Pain in right toe(s): Secondary | ICD-10-CM | POA: Diagnosis not present

## 2016-06-20 DIAGNOSIS — M79675 Pain in left toe(s): Secondary | ICD-10-CM | POA: Diagnosis not present

## 2016-06-20 DIAGNOSIS — B351 Tinea unguium: Secondary | ICD-10-CM | POA: Diagnosis not present

## 2016-12-28 DIAGNOSIS — M79672 Pain in left foot: Secondary | ICD-10-CM | POA: Diagnosis not present

## 2016-12-28 DIAGNOSIS — M79671 Pain in right foot: Secondary | ICD-10-CM | POA: Diagnosis not present

## 2016-12-28 DIAGNOSIS — B351 Tinea unguium: Secondary | ICD-10-CM | POA: Diagnosis not present

## 2017-01-04 DIAGNOSIS — M79672 Pain in left foot: Secondary | ICD-10-CM | POA: Diagnosis not present

## 2017-01-04 DIAGNOSIS — I10 Essential (primary) hypertension: Secondary | ICD-10-CM | POA: Diagnosis not present

## 2017-01-04 DIAGNOSIS — K0510 Chronic gingivitis, plaque induced: Secondary | ICD-10-CM | POA: Diagnosis not present

## 2017-03-22 DIAGNOSIS — Q828 Other specified congenital malformations of skin: Secondary | ICD-10-CM | POA: Diagnosis not present

## 2017-03-22 DIAGNOSIS — M79672 Pain in left foot: Secondary | ICD-10-CM | POA: Diagnosis not present

## 2017-05-13 LAB — GLUCOSE, POCT (MANUAL RESULT ENTRY): POC GLUCOSE: 122 mg/dL — AB (ref 70–99)

## 2017-05-22 ENCOUNTER — Ambulatory Visit (INDEPENDENT_AMBULATORY_CARE_PROVIDER_SITE_OTHER): Payer: BLUE CROSS/BLUE SHIELD | Admitting: Adult Health

## 2017-05-22 ENCOUNTER — Encounter: Payer: Self-pay | Admitting: Adult Health

## 2017-05-22 VITALS — BP 126/70 | HR 76 | Ht 67.0 in | Wt 245.0 lb

## 2017-05-22 DIAGNOSIS — N950 Postmenopausal bleeding: Secondary | ICD-10-CM | POA: Diagnosis not present

## 2017-05-22 NOTE — Progress Notes (Signed)
Subjective:     Patient ID: Tonya Nelson, female   DOB: February 05, 1956, 62 y.o.   MRN: [id number]  HPI Tonya Nelson is a 62 year old white female, widowed, PM, in complaining of having vaginal bleeding for 1 week the end of March,just like a period, has stopped.And is sees Dr Tonya Nelson for nail care and has pain when walks, has porokeratosis. She had episode of PMB in 2016 and biopsy showed benign polyp.  PCP is Dr Tonya Nelson.  Review of Systems Had bleeding for 1 week the ned of March like a period, has stopped Feet hurt when walks  Reviewed past medical,surgical, social and family history. Reviewed medications and allergies.     Objective:   Physical Exam BP 126/70 (BP Location: Left Arm, Patient Position: Sitting, Cuff Size: Large)   Pulse 76   Ht 5\' 7"  (1.702 m)   Wt 245 lb (111.1 kg)   BMI 38.37 kg/m   Skin warm and dry.Pelvic: external genitalia is normal in appearance no lesions, vagina: pale pink,urethra has no lesions or masses noted, cervix:smooth, uterus: normal size, shape and contour, non tender, no masses felt, adnexa: no masses or tenderness noted. Bladder is non tender and no masses felt. Feet dry, with thickened skin in few places.   Will get [place] to assess uterus.  Assessment:     1. PMB (postmenopausal bleeding)       Plan:     Return in 1 day for GYN [place] Review handout on PMB I gave her a handout from up to date on porokeratosis, she has on her left foot  Could try mole skin

## 2017-05-22 NOTE — Patient Instructions (Signed)

## 2017-05-23 ENCOUNTER — Telehealth: Payer: Self-pay | Admitting: Adult Health

## 2017-05-23 ENCOUNTER — Encounter: Payer: Self-pay | Admitting: Adult Health

## 2017-05-23 ENCOUNTER — Ambulatory Visit (INDEPENDENT_AMBULATORY_CARE_PROVIDER_SITE_OTHER): Payer: BLUE CROSS/BLUE SHIELD

## 2017-05-23 DIAGNOSIS — D259 Leiomyoma of uterus, unspecified: Secondary | ICD-10-CM | POA: Diagnosis not present

## 2017-05-23 DIAGNOSIS — N8500 Endometrial hyperplasia, unspecified: Secondary | ICD-10-CM | POA: Diagnosis not present

## 2017-05-23 DIAGNOSIS — N950 Postmenopausal bleeding: Secondary | ICD-10-CM

## 2017-05-23 DIAGNOSIS — R9389 Abnormal findings on diagnostic imaging of other specified body structures: Secondary | ICD-10-CM

## 2017-05-23 HISTORY — DX: Abnormal findings on diagnostic imaging of other specified body structures: R93.89

## 2017-05-23 NOTE — Progress Notes (Signed)
PELVIC US TA/TV: heterogeneous anteverted uterus w/a fundal fibroid that distorts the endometrium 3 x 2.4 x 3.4 cm,thickened endometrium 8.5 mm,normal ovaries bilat,no free fluid,ovaries appear mobile

## 2017-05-23 NOTE — Telephone Encounter (Signed)
Pt aware US showed normal ovaries, and thickened endometrium of 8.5 mm needs endometrial biopsy, wants Dr Glo Herring. She also has small fundal fibroid.

## 2017-06-07 ENCOUNTER — Ambulatory Visit (INDEPENDENT_AMBULATORY_CARE_PROVIDER_SITE_OTHER): Payer: BLUE CROSS/BLUE SHIELD | Admitting: Obstetrics and Gynecology

## 2017-06-07 ENCOUNTER — Encounter: Payer: Self-pay | Admitting: Obstetrics and Gynecology

## 2017-06-07 ENCOUNTER — Other Ambulatory Visit: Payer: Self-pay | Admitting: Obstetrics and Gynecology

## 2017-06-07 VITALS — BP 146/82 | HR 65 | Ht 67.5 in | Wt 244.2 lb

## 2017-06-07 DIAGNOSIS — N950 Postmenopausal bleeding: Secondary | ICD-10-CM | POA: Diagnosis not present

## 2017-06-07 DIAGNOSIS — R9389 Abnormal findings on diagnostic imaging of other specified body structures: Secondary | ICD-10-CM | POA: Diagnosis not present

## 2017-06-07 NOTE — Progress Notes (Addendum)
JENNALEE GREAVES is a 62 y.o. female she has had 2 prior endometrial biopsies according to her history.  She had endometrial thickening.  Ultrasound shows a fundal fibroid deforming the top of the endometrial cavity and a moderately thickened endometrium  Endometrial Biopsy: Patient given informed consent, signed copy in the chart, time out was performed. Time out taken. The patient was placed in the lithotomy position and the cervix brought into view with sterile speculum.  Portion of cervix cleansed x 2 with betadine swabs.  A tenaculum was placed in the anterior lip of the cervix. The uterus was sounded for depth of 7.5 cm,. Milex uterine Explora 3 mm was introduced to into the uterus, suction created,  and an endometrial sample was obtained. All equipment was removed and accounted for.   The patient tolerated the procedure well.    Patient given post procedure instructions.  Followup: 1 week The patient wishes to meet face-to-face to discuss long-term strategy because this is her third endometrial biopsy   By signing my name below, I, Izna Ahmed, attest that this documentation has been prepared under the direction and in the presence of Jonnie Kind, MD. Electronically Signed: Jabier Gauss, Medical Scribe. 06/07/17. 2:08 PM.  I personally performed the services described in this documentation, which was SCRIBED in my presence. The recorded information has been reviewed and considered accurate. It has been edited as necessary during review. Jonnie Kind, MD

## 2017-06-07 NOTE — Addendum Note (Signed)
Addended by: Levy Pupa S on: 06/07/2017 03:00 PM   Modules accepted: Orders

## 2017-06-13 ENCOUNTER — Telehealth: Payer: Self-pay | Admitting: Obstetrics and Gynecology

## 2017-06-13 NOTE — Telephone Encounter (Signed)
Informed patient that the path showed benign proliferative endometrium in a postmenopausal female without any hormone therapy. Workup incomplete. Patient needs an u/s transvaginal and appointment to discuss per Dr Johnnye Sima note.  Pt verbalized understanding and will be at appointment tomorrow.

## 2017-06-14 ENCOUNTER — Encounter: Payer: Self-pay | Admitting: Obstetrics and Gynecology

## 2017-06-14 ENCOUNTER — Ambulatory Visit: Payer: BLUE CROSS/BLUE SHIELD | Admitting: Obstetrics and Gynecology

## 2017-06-14 VITALS — BP 140/78 | HR 57 | Ht 67.5 in | Wt 245.8 lb

## 2017-06-14 DIAGNOSIS — Q828 Other specified congenital malformations of skin: Secondary | ICD-10-CM | POA: Diagnosis not present

## 2017-06-14 DIAGNOSIS — D259 Leiomyoma of uterus, unspecified: Secondary | ICD-10-CM

## 2017-06-14 DIAGNOSIS — R9389 Abnormal findings on diagnostic imaging of other specified body structures: Secondary | ICD-10-CM | POA: Diagnosis not present

## 2017-06-14 DIAGNOSIS — M79672 Pain in left foot: Secondary | ICD-10-CM | POA: Diagnosis not present

## 2017-06-14 DIAGNOSIS — N950 Postmenopausal bleeding: Secondary | ICD-10-CM

## 2017-06-14 MED ORDER — MEDROXYPROGESTERONE ACETATE 10 MG PO TABS: 10.0000 mg | ORAL_TABLET | Freq: Every day | ORAL | 3 refills | Status: DC

## 2017-06-14 NOTE — Progress Notes (Signed)
Patient ID: Tonya Nelson, female   DOB: [birth year], 62 y.o.   MRN: [id number]   [facility] Visit  @DATE @            Patient name: Tonya Nelson MRN [id number]  Date of birth: 1955-05-05  CC & HPI:  Tonya Nelson is a 62 y.o. female presenting today to discuss her recent biopsy results from 06/07/2017. Per chart review, she has had a total of three endometrial biopsies including the most recent one. She also had a transvaginal ultra sound on 05/24/2017. She denies vaginal bleeding, fever, chills or any other symptoms or complaints at this time.   ROS:  ROS -vaginal bleeding -fever -chills All systems are negative except as noted in the HPI and PMH.   Pertinent History Reviewed:   Reviewed: Significant for endometrial polyp, thickened endometrium Medical         Past Medical History:  Diagnosis Date  . Cellulitis    in toe on left foot  . Endometrial polyp 12/2014  . Essential hypertension   . Osteoporosis   . PMB (postmenopausal bleeding) 11/18/2014  . Porokeratosis   . Thickened endometrium 05/23/2017   Will get endo bx                              Surgical Hx:    Past Surgical History:  Procedure Laterality Date  . WISDOM TOOTH EXTRACTION     Medications: Reviewed & Updated - see associated section                       Current Outpatient Medications:  .  bisoprolol-hydrochlorothiazide (ZIAC) 10-6.25 MG tablet, Take 1 tablet by mouth daily., Disp: , Rfl:  .  cholecalciferol (VITAMIN D) 1000 units tablet, Take 2,000 Units by mouth daily., Disp: , Rfl:    Social History: Reviewed -  reports that she has never smoked. She has never used smokeless tobacco.  Objective Findings:  Vitals: Blood pressure 140/78, pulse (!) 57, height 5' 7.5" (1.715 m), weight 245 lb 12.8 oz (111.5 kg).  PHYSICAL EXAMINATION General appearance - alert, well appearing, and in no distress, oriented to person, place, and time and overweight Mental status - alert, oriented to person, place,  and time, normal mood, behavior, speech, dress, motor activity, and thought processes, affect appropriate to mood  PELVIC DEFERRED GYNECOLOGIC SONOGRAM from last month   Tonya Nelson is a 62 y.o. G1P1 she is here for a pelvic sonogram for postmenopausal bleeding.  Uterus                      8.2 x 4.9 x 6.6 cm, vol 140 ml, heterogeneous anteverted uterus w/a fundal fibroid that distorts the endometrium 3 x 2.4 x 3.4 cm  Endometrium          8.5 mm, symmetrical, thickened endometrium, the distal end of the endometrium is distorted by the fundal fibroid  Right ovary             2 x 2 x 1.8 cm, wnl  Left ovary                1.6 x 1.2 x 1.9 cm, wnl  No free fluid   Technician Comments:  PELVIC US TA/TV: heterogeneous anteverted uterus w/a fundal fibroid that distorts the endometrium 3 x 2.4 x 3.4 cm,thickened endometrium 8.5 mm,normal ovaries bilat,no free  fluid,ovaries appear mobile    [employer] 05/23/2017 3:02 PM  Clinical Impression and recommendations:  I have reviewed the sonogram results above, combined with the patient's current clinical course, below are my impressions and any appropriate recommendations for management based on the sonographic findings.  Uterus with 3.4 cm submucosal myoma Thickened endometrium in a post menopausal woman, requires endometrial sampling Both ovaries normal consistent with Ellis Savage 05/24/2017 6:46 PM Discussion: 1. Discussed with pt her results of her recent endometrial biopsy that resulted benign  2. She had previously had an ultra sound that showed a fundal fibroid at the top of the endometrial cavity.   At end of discussion, pt had opportunity to ask questions and has no further questions at this time.   Specific discussion of endometrial biopsy results as noted above. Greater than 50% was spent in counseling and coordination of care with the patient.   Total time greater than: 15 minutes.     Assessment & Plan:   A:  1. Post menopausal bleeding 2. proliferated endometrial bleeding IN POSTMENO FEMALE  P:  1. Rx Provera x 14 days per month x 3 months 2. F/U 6 months  By signing my name below, I, Margit Banda, attest that this documentation has been prepared under the direction and in the presence of Jonnie Kind, MD. Electronically Signed: Margit Banda, Medical Scribe. 06/14/17. 12:38 PM.  I personally performed the services described in this documentation, which was SCRIBED in my presence. The recorded information has been reviewed and considered accurate. It has been edited as necessary during review. Jonnie Kind, MD

## 2017-09-06 DIAGNOSIS — M79671 Pain in right foot: Secondary | ICD-10-CM | POA: Diagnosis not present

## 2017-09-06 DIAGNOSIS — M79672 Pain in left foot: Secondary | ICD-10-CM | POA: Diagnosis not present

## 2017-09-06 DIAGNOSIS — L853 Xerosis cutis: Secondary | ICD-10-CM | POA: Diagnosis not present

## 2017-09-07 LAB — GLUCOSE, POCT (MANUAL RESULT ENTRY): POC Glucose: 165 mg/dl — AB (ref 70–99)

## 2018-01-22 DIAGNOSIS — L720 Epidermal cyst: Secondary | ICD-10-CM | POA: Diagnosis not present

## 2018-02-01 DIAGNOSIS — M79671 Pain in right foot: Secondary | ICD-10-CM | POA: Diagnosis not present

## 2018-02-01 DIAGNOSIS — M79672 Pain in left foot: Secondary | ICD-10-CM | POA: Diagnosis not present

## 2018-02-01 DIAGNOSIS — B351 Tinea unguium: Secondary | ICD-10-CM | POA: Diagnosis not present

## 2018-02-01 DIAGNOSIS — L110 Acquired keratosis follicularis: Secondary | ICD-10-CM | POA: Diagnosis not present

## 2018-04-16 DIAGNOSIS — J209 Acute bronchitis, unspecified: Secondary | ICD-10-CM | POA: Diagnosis not present

## 2018-04-16 DIAGNOSIS — I10 Essential (primary) hypertension: Secondary | ICD-10-CM | POA: Diagnosis not present

## 2018-04-16 DIAGNOSIS — J4531 Mild persistent asthma with (acute) exacerbation: Secondary | ICD-10-CM | POA: Diagnosis not present

## 2018-04-19 DIAGNOSIS — M79671 Pain in right foot: Secondary | ICD-10-CM | POA: Diagnosis not present

## 2018-04-19 DIAGNOSIS — M79672 Pain in left foot: Secondary | ICD-10-CM | POA: Diagnosis not present

## 2018-04-19 DIAGNOSIS — I739 Peripheral vascular disease, unspecified: Secondary | ICD-10-CM | POA: Diagnosis not present

## 2018-04-19 DIAGNOSIS — L110 Acquired keratosis follicularis: Secondary | ICD-10-CM | POA: Diagnosis not present

## 2018-04-24 NOTE — Congregational Nurse Program (Signed)
  Dept: [phone]   Congregational Nurse Program Note  Date of Encounter: 04/24/2018  Past Medical History: Past Medical History:  Diagnosis Date  . Cellulitis    in toe on left foot  . Endometrial polyp 12/2014  . Essential hypertension   . Osteoporosis   . PMB (postmenopausal bleeding) 11/18/2014  . Porokeratosis   . Thickened endometrium 05/23/2017   Will get endo bx    Encounter Details: CNP Questionnaire - 04/24/18 1015      Questionnaire   Patient Status  Not Applicable    Race  White or Caucasian    Location Patient Served At  [facility]    Uninsured  Not Applicable    Food  Yes, have food insecurities    Housing/Utilities  Yes, have permanent housing    Transportation  No transportation needs    Interpersonal Safety  Yes, feel physically and emotionally safe where you currently live    Medication  No medication insecurities    Medical Provider  Yes    Referrals  Not Applicable    ED Visit Averted  Not Applicable    Life-Saving Intervention Made  Not Applicable     Requested Blood Pressure check  Blood pressure 132/72  Pulse 59.  Sees her doctor regularly.  Theron Arista, RN  [phone]

## 2018-07-24 DIAGNOSIS — I739 Peripheral vascular disease, unspecified: Secondary | ICD-10-CM | POA: Diagnosis not present

## 2018-07-24 DIAGNOSIS — B353 Tinea pedis: Secondary | ICD-10-CM | POA: Diagnosis not present

## 2018-07-24 DIAGNOSIS — M79671 Pain in right foot: Secondary | ICD-10-CM | POA: Diagnosis not present

## 2018-07-24 DIAGNOSIS — M79672 Pain in left foot: Secondary | ICD-10-CM | POA: Diagnosis not present

## 2018-10-17 DIAGNOSIS — I10 Essential (primary) hypertension: Secondary | ICD-10-CM | POA: Diagnosis not present

## 2018-10-17 DIAGNOSIS — R600 Localized edema: Secondary | ICD-10-CM | POA: Diagnosis not present

## 2018-10-17 DIAGNOSIS — E669 Obesity, unspecified: Secondary | ICD-10-CM | POA: Diagnosis not present

## 2018-10-17 DIAGNOSIS — J4531 Mild persistent asthma with (acute) exacerbation: Secondary | ICD-10-CM | POA: Diagnosis not present

## 2019-01-03 DIAGNOSIS — M79672 Pain in left foot: Secondary | ICD-10-CM | POA: Diagnosis not present

## 2019-01-03 DIAGNOSIS — M79671 Pain in right foot: Secondary | ICD-10-CM | POA: Diagnosis not present

## 2019-01-03 DIAGNOSIS — L110 Acquired keratosis follicularis: Secondary | ICD-10-CM | POA: Diagnosis not present

## 2019-01-03 DIAGNOSIS — I739 Peripheral vascular disease, unspecified: Secondary | ICD-10-CM | POA: Diagnosis not present

## 2019-01-04 DIAGNOSIS — I10 Essential (primary) hypertension: Secondary | ICD-10-CM | POA: Diagnosis not present

## 2019-01-04 DIAGNOSIS — R109 Unspecified abdominal pain: Secondary | ICD-10-CM | POA: Diagnosis not present

## 2019-01-04 DIAGNOSIS — J4531 Mild persistent asthma with (acute) exacerbation: Secondary | ICD-10-CM | POA: Diagnosis not present

## 2019-01-17 DIAGNOSIS — H2513 Age-related nuclear cataract, bilateral: Secondary | ICD-10-CM | POA: Diagnosis not present

## 2019-01-17 DIAGNOSIS — H0102A Squamous blepharitis right eye, upper and lower eyelids: Secondary | ICD-10-CM | POA: Diagnosis not present

## 2019-01-17 DIAGNOSIS — I10 Essential (primary) hypertension: Secondary | ICD-10-CM | POA: Diagnosis not present

## 2019-01-17 DIAGNOSIS — H539 Unspecified visual disturbance: Secondary | ICD-10-CM | POA: Diagnosis not present

## 2019-01-17 DIAGNOSIS — H43811 Vitreous degeneration, right eye: Secondary | ICD-10-CM | POA: Diagnosis not present

## 2019-01-17 DIAGNOSIS — H0102B Squamous blepharitis left eye, upper and lower eyelids: Secondary | ICD-10-CM | POA: Diagnosis not present

## 2019-04-18 ENCOUNTER — Encounter: Payer: Self-pay | Admitting: Family Medicine

## 2019-04-18 ENCOUNTER — Other Ambulatory Visit: Payer: Self-pay

## 2019-04-18 ENCOUNTER — Ambulatory Visit (INDEPENDENT_AMBULATORY_CARE_PROVIDER_SITE_OTHER): Payer: 59 | Admitting: Family Medicine

## 2019-04-18 VITALS — BP 151/79 | HR 55 | Temp 97.7°F | Ht 67.0 in | Wt 232.0 lb

## 2019-04-18 DIAGNOSIS — I10 Essential (primary) hypertension: Secondary | ICD-10-CM | POA: Insufficient documentation

## 2019-04-18 DIAGNOSIS — M810 Age-related osteoporosis without current pathological fracture: Secondary | ICD-10-CM | POA: Diagnosis not present

## 2019-04-18 MED ORDER — FUROSEMIDE 40 MG PO TABS: 40.0000 mg | ORAL_TABLET | Freq: Every day | ORAL | 1 refills | Status: DC

## 2019-04-18 NOTE — Progress Notes (Signed)
New Patient Office Visit  Subjective:  Patient ID: Tonya Nelson, female    DOB: [birth year]  Age: 64 y.o. MRN: [id number]  CC:  Chief Complaint  Patient presents with  . Hypertension    swelling in ankles    HPI Tonya Nelson presents for ankles swelling and HTN Pt is not currently taking blood pressure medication  Osteoporosis-no recent DEXA  Past Medical History:  Diagnosis Date  . Asthma   . Cellulitis    in toe on left foot  . Endometrial polyp 12/2014  . Essential hypertension   . Osteoporosis   . PMB (postmenopausal bleeding) 11/18/2014  . Porokeratosis   . Thickened endometrium 05/23/2017   Will get endo bx    Past Surgical History:  Procedure Laterality Date  . WISDOM TOOTH EXTRACTION      Family History  Problem Relation Age of Onset  . Osteoporosis Mother   . Stomach cancer Mother   . Cancer Mother   . Bone cancer Father   . Cancer Father   . Parkinson's disease Brother   . Diabetes Maternal Grandmother   . Glaucoma Maternal Grandfather     Social History   Socioeconomic History  . Marital status: Widowed    Spouse name: Not on file  . Number of children: Not on file  . Years of education: Not on file  . Highest education level: Not on file  Occupational History  . Occupation: Retired  Tobacco Use  . Smoking status: Never Smoker  . Smokeless tobacco: Never Used  Substance and Sexual Activity  . Alcohol use: No  . Drug use: No  . Sexual activity: Not Currently    Birth control/protection: Post-menopausal  Other Topics Concern  . Not on file  Social History Narrative  . Not on file   Social Determinants of Health   Financial Resource Strain:   . Difficulty of Paying Living Expenses: Not on file  Food Insecurity:   . Worried About Charity fundraiser in the Last Year: Not on file  . Ran Out of Food in the Last Year: Not on file  Transportation Needs:   . Lack of Transportation (Medical): Not on file  . Lack of Transportation  (Non-Medical): Not on file  Physical Activity:   . Days of Exercise per Week: Not on file  . Minutes of Exercise per Session: Not on file  Stress:   . Feeling of Stress : Not on file  Social Connections:   . Frequency of Communication with Friends and Family: Not on file  . Frequency of Social Gatherings with Friends and Family: Not on file  . Attends Religious Services: Not on file  . Active Member of Clubs or Organizations: Not on file  . Attends Archivist Meetings: Not on file  . Marital Status: Not on file  Intimate Partner Violence:   . Fear of Current or Ex-Partner: Not on file  . Emotionally Abused: Not on file  . Physically Abused: Not on file  . Sexually Abused: Not on file    ROS Review of Systems  Constitutional: Negative.   HENT: Negative.   Eyes:       Sees Dr. Omelia Blackwater detachment  Respiratory: Negative.   Cardiovascular: Negative.   Gastrointestinal: Negative.   Endocrine: Negative.   Genitourinary: Negative.   Allergic/Immunologic: Negative.   Neurological: Negative.   Hematological: Negative.     Objective:   Today's Vitals: BP (!) 151/79 (BP Location: Left Arm, Patient  Position: Sitting)   Pulse (!) 55   Temp 97.7 F (36.5 C) (Temporal)   Ht 5\' 7"  (1.702 m)   Wt 232 lb (105.2 kg)   SpO2 98%   BMI 36.34 kg/m   Physical Exam Constitutional:      Appearance: Normal appearance.  HENT:     Head: Normocephalic and atraumatic.  Eyes:     Conjunctiva/sclera: Conjunctivae normal.  Cardiovascular:     Rate and Rhythm: Normal rate and regular rhythm.     Pulses: Normal pulses.     Heart sounds: Normal heart sounds.  Pulmonary:     Effort: Pulmonary effort is normal.     Breath sounds: Normal breath sounds.  Musculoskeletal:        General: No swelling.     Cervical back: Normal range of motion and neck supple.     Right lower leg: Edema present.     Left lower leg: Edema present.  Neurological:     Mental Status: She is alert  and oriented to person, place, and time.  Psychiatric:        Mood and Affect: Mood normal.        Behavior: Behavior normal.     Assessment & Plan:    Outpatient Encounter Medications as of 04/18/2019  Medication Sig  . bisoprolol-hydrochlorothiazide (ZIAC) 10-6.25 MG tablet Take 1 tablet by mouth daily.  . cholecalciferol (VITAMIN D) 1000 units tablet Take 2,000 Units by mouth daily.  . [DISCONTINUED] medroxyPROGESTERone (PROVERA) 10 MG tablet Take 1 tablet (10 mg total) by mouth daily. One tablet daily x 2 weeks. Repeat as directed   No facility-administered encounter medications on file as of 04/18/2019.   1. Osteoporosis, unspecified osteoporosis type, unspecified pathological fracture presence No recent DEXA - 25-Hydroxyvitamin D Lcms D2+D3 - CBC w/Diff/Platelet - VITAMIN D 25 Hydroxy (Vit-D Deficiency, Fractures)  2. Essential hypertension Restart lasix and ziac - CBC w/Diff/Platelet - COMPLETE METABOLIC PANEL WITH GFR - Lipid panel - TSH - Urinalysis Follow-up: 2 weeks-back on HTN medications  Tonya Bjelland Hannah Beat, MD

## 2019-04-18 NOTE — Patient Instructions (Addendum)
Fasting labwork next week  Take lasix every morning for 2 weeks-follow up visit  DEXA-bone density at the hospital

## 2019-04-24 ENCOUNTER — Other Ambulatory Visit: Payer: Self-pay | Admitting: Emergency Medicine

## 2019-04-24 ENCOUNTER — Other Ambulatory Visit: Payer: Self-pay | Admitting: Family Medicine

## 2019-04-24 DIAGNOSIS — R7309 Other abnormal glucose: Secondary | ICD-10-CM

## 2019-04-25 LAB — HEMOGLOBIN A1C W/OUT EAG: Hgb A1c MFr Bld: 5.3 % of total Hgb (ref <5.7)

## 2019-04-25 LAB — CBC WITH DIFFERENTIAL/PLATELET
Absolute Monocytes: 435 cells/uL (ref 200–950)
Basophils Absolute: 29 cells/uL (ref 0–200)
Basophils Relative: 0.5 %
Eosinophils Absolute: 203 cells/uL (ref 15–500)
Eosinophils Relative: 3.5 %
HCT: 40.9 % (ref 35.0–45.0)
Hemoglobin: 14 g/dL (ref 11.7–15.5)
Lymphs Abs: 2192 cells/uL (ref 850–3900)
MCH: 29.5 pg (ref 27.0–33.0)
MCHC: 34.2 g/dL (ref 32.0–36.0)
MCV: 86.3 fL (ref 80.0–100.0)
MPV: 10.4 fL (ref 7.5–12.5)
Monocytes Relative: 7.5 %
Neutro Abs: 2941 cells/uL (ref 1500–7800)
Neutrophils Relative %: 50.7 %
Platelets: 221 10*3/uL (ref 140–400)
RBC: 4.74 10*6/uL (ref 3.80–5.10)
RDW: 12.7 % (ref 11.0–15.0)
Total Lymphocyte: 37.8 %
WBC: 5.8 10*3/uL (ref 3.8–10.8)

## 2019-04-25 LAB — URINALYSIS
Bilirubin Urine: NEGATIVE
Glucose, UA: NEGATIVE
Hgb urine dipstick: NEGATIVE
Ketones, ur: NEGATIVE
Leukocytes,Ua: NEGATIVE
Nitrite: NEGATIVE
Protein, ur: NEGATIVE
Specific Gravity, Urine: 1.022 (ref 1.001–1.03)
pH: 5.5 (ref 5.0–8.0)

## 2019-04-25 LAB — TEST AUTHORIZATION

## 2019-04-25 LAB — COMPLETE METABOLIC PANEL WITH GFR
AG Ratio: 1.6 (calc) (ref 1.0–2.5)
ALT: 20 U/L (ref 6–29)
AST: 20 U/L (ref 10–35)
Albumin: 4.4 g/dL (ref 3.6–5.1)
Alkaline phosphatase (APISO): 43 U/L (ref 37–153)
BUN: 14 mg/dL (ref 7–25)
CO2: 29 mmol/L (ref 20–32)
Calcium: 9.8 mg/dL (ref 8.6–10.4)
Chloride: 103 mmol/L (ref 98–110)
Creat: 0.79 mg/dL (ref 0.50–0.99)
GFR, Est African American: 92 mL/min/{1.73_m2} (ref >=60)
GFR, Est Non African American: 79 mL/min/{1.73_m2} (ref >=60)
Globulin: 2.8 g/dL (calc) (ref 1.9–3.7)
Glucose, Bld: 109 mg/dL — ABNORMAL HIGH (ref 65–99)
Potassium: 3.9 mmol/L (ref 3.5–5.3)
Sodium: 139 mmol/L (ref 135–146)
Total Bilirubin: 0.5 mg/dL (ref 0.2–1.2)
Total Protein: 7.2 g/dL (ref 6.1–8.1)

## 2019-04-25 LAB — LIPID PANEL
Cholesterol: 176 mg/dL (ref <200)
HDL: 40 mg/dL — ABNORMAL LOW (ref >=50)
LDL Cholesterol (Calc): 113 mg/dL (calc) — ABNORMAL HIGH
Non-HDL Cholesterol (Calc): 136 mg/dL (calc) — ABNORMAL HIGH (ref <130)
Total CHOL/HDL Ratio: 4.4 (calc) (ref <5.0)
Triglycerides: 121 mg/dL (ref <150)

## 2019-04-25 LAB — TSH: TSH: 1.41 mIU/L (ref 0.40–4.50)

## 2019-04-30 ENCOUNTER — Other Ambulatory Visit: Payer: Self-pay | Admitting: Family Medicine

## 2019-04-30 ENCOUNTER — Other Ambulatory Visit: Payer: Self-pay

## 2019-04-30 ENCOUNTER — Encounter: Payer: Self-pay | Admitting: Family Medicine

## 2019-04-30 ENCOUNTER — Ambulatory Visit (INDEPENDENT_AMBULATORY_CARE_PROVIDER_SITE_OTHER): Payer: 59 | Admitting: Family Medicine

## 2019-04-30 ENCOUNTER — Other Ambulatory Visit (HOSPITAL_COMMUNITY): Payer: Self-pay | Admitting: Family Medicine

## 2019-04-30 VITALS — BP 118/75 | HR 54 | Temp 97.7°F | Ht 67.0 in | Wt 233.0 lb

## 2019-04-30 DIAGNOSIS — R7309 Other abnormal glucose: Secondary | ICD-10-CM | POA: Diagnosis not present

## 2019-04-30 DIAGNOSIS — Z13820 Encounter for screening for osteoporosis: Secondary | ICD-10-CM

## 2019-04-30 DIAGNOSIS — M810 Age-related osteoporosis without current pathological fracture: Secondary | ICD-10-CM | POA: Diagnosis not present

## 2019-04-30 DIAGNOSIS — I10 Essential (primary) hypertension: Secondary | ICD-10-CM

## 2019-04-30 NOTE — Progress Notes (Signed)
Established Patient Office Visit  Subjective:  Patient ID: Tonya Nelson, female    DOB: [birth year]  Age: 64 y.o. MRN: [id number]  CC:  Chief Complaint  Patient presents with  . Follow-up    3 week f/u for osteoporosis   HPI [name] presents for edema- pt no longer taking lasix-took for several days then discontinued-pt continuing to take ziac daily Taking Vit D daily Pt with fall on 3/2-soreness Past Medical History:  Diagnosis Date  . Asthma   . Cellulitis    in toe on left foot  . Endometrial polyp 12/2014  . Essential hypertension   . Osteoporosis   . PMB (postmenopausal bleeding) 11/18/2014  . Porokeratosis   . Thickened endometrium 05/23/2017   Will get endo bx    Past Surgical History:  Procedure Laterality Date  . WISDOM TOOTH EXTRACTION      Family History  Problem Relation Age of Onset  . Osteoporosis Mother   . Stomach cancer Mother   . Cancer Mother   . Bone cancer Father   . Cancer Father   . Parkinson's disease Brother   . Diabetes Maternal Grandmother   . Glaucoma Maternal Grandfather     Social History   Socioeconomic History  . Marital status: Widowed    Spouse name: Not on file  . Number of children: Not on file  . Years of education: Not on file  . Highest education level: Not on file  Occupational History  . Occupation: Retired  Tobacco Use  . Smoking status: Never Smoker  . Smokeless tobacco: Never Used  Substance and Sexual Activity  . Alcohol use: No  . Drug use: No  . Sexual activity: Not Currently    Birth control/protection: Post-menopausal  Other Topics Concern  . Not on file  Social History Narrative  . Not on file   Social Determinants of Health   Financial Resource Strain:   . Difficulty of Paying Living Expenses:   Food Insecurity:   . Worried About Charity fundraiser in the Last Year:   . Arboriculturist in the Last Year:   Transportation Needs:   . Film/video editor (Medical):   Marland Kitchen Lack of  Transportation (Non-Medical):   Physical Activity:   . Days of Exercise per Week:   . Minutes of Exercise per Session:   Stress:   . Feeling of Stress :   Social Connections:   . Frequency of Communication with Friends and Family:   . Frequency of Social Gatherings with Friends and Family:   . Attends Religious Services:   . Active Member of Clubs or Organizations:   . Attends Archivist Meetings:   Marland Kitchen Marital Status:   Intimate Partner Violence:   . Fear of Current or Ex-Partner:   . Emotionally Abused:   Marland Kitchen Physically Abused:   . Sexually Abused:     Outpatient Medications Prior to Visit  Medication Sig Dispense Refill  . bisoprolol-hydrochlorothiazide (ZIAC) 10-6.25 MG tablet Take 1 tablet by mouth daily.    . cholecalciferol (VITAMIN D) 1000 units tablet Take 2,000 Units by mouth daily.    . furosemide (LASIX) 40 MG tablet Take 1 tablet (40 mg total) by mouth daily. 30 tablet 1   No facility-administered medications prior to visit.    Allergies  Allergen Reactions  . Penicillins Rash    ROS Review of Systems  Musculoskeletal:       Swelling improved with lasix  Objective:    Physical Exam  Constitutional: She is oriented to person, place, and time. She appears well-developed and well-nourished.  Cardiovascular: Normal rate and regular rhythm.  Pulmonary/Chest: Effort normal and breath sounds normal.  Neurological: She is oriented to person, place, and time.  Psychiatric: She has a normal mood and affect. Her behavior is normal.    BP 118/75 (BP Location: Left Arm, Patient Position: Sitting, Cuff Size: Large)   Pulse (!) 54   Temp 97.7 F (36.5 C) (Temporal)   Ht 5\' 7"  (1.702 m)   Wt 233 lb (105.7 kg)   SpO2 96%   BMI 36.49 kg/m  Wt Readings from Last 3 Encounters:  04/30/19 233 lb (105.7 kg)  04/18/19 232 lb (105.2 kg)  06/14/17 245 lb 12.8 oz (111.5 kg)     Health Maintenance Due  Topic Date Due  . Hepatitis C Screening  Never done   . HIV Screening  Never done  . TETANUS/TDAP  Never done  . COLONOSCOPY  Never done  . MAMMOGRAM  11/27/2009  . PAP SMEAR-Modifier  12/24/2017    Lab Results  Component Value Date   TSH 1.41 04/23/2019   Lab Results  Component Value Date   WBC 5.8 04/23/2019   HGB 14.0 04/23/2019   HCT 40.9 04/23/2019   MCV 86.3 04/23/2019   PLT 221 04/23/2019   Lab Results  Component Value Date   NA 139 04/23/2019   K 3.9 04/23/2019   CO2 29 04/23/2019   GLUCOSE 109 (H) 04/23/2019   BUN 14 04/23/2019   CREATININE 0.79 04/23/2019   BILITOT 0.5 04/23/2019   ALKPHOS 35 10/05/2015   AST 20 04/23/2019   ALT 20 04/23/2019   PROT 7.2 04/23/2019   ALBUMIN 3.9 10/05/2015   CALCIUM 9.8 04/23/2019   Lab Results  Component Value Date   CHOL 176 04/23/2019   Lab Results  Component Value Date   HDL 40 (L) 04/23/2019   Lab Results  Component Value Date   LDLCALC 113 (H) 04/23/2019   Lab Results  Component Value Date   TRIG 121 04/23/2019   Lab Results  Component Value Date   CHOLHDL 4.4 04/23/2019   Lab Results  Component Value Date   HGBA1C 5.3 04/23/2019      Assessment & Plan:  1. Osteoporosis, unspecified osteoporosis type, unspecified pathological fracture presence Start Calcium 500mg  BID Increase Vit D 5000 daily D/w pt DEXA-refer for evaluation 2. Elevated glucose A1c normal 3. Essential hypertension ziac daily, lasix no longer taking Follow-up:   Kalel Harty Hannah Beat, MD

## 2019-04-30 NOTE — Patient Instructions (Addendum)

## 2019-05-03 ENCOUNTER — Other Ambulatory Visit: Payer: Self-pay

## 2019-05-03 ENCOUNTER — Ambulatory Visit (HOSPITAL_COMMUNITY)
Admission: RE | Admit: 2019-05-03 | Discharge: 2019-05-03 | Disposition: A | Discharge status: Home / Self Care | Payer: 59 | Source: Ambulatory Visit | Attending: Family Medicine | Admitting: Family Medicine

## 2019-05-03 ENCOUNTER — Other Ambulatory Visit: Payer: 59

## 2019-05-03 DIAGNOSIS — Z13820 Encounter for screening for osteoporosis: Secondary | ICD-10-CM

## 2019-05-07 ENCOUNTER — Telehealth: Payer: Self-pay | Admitting: Emergency Medicine

## 2019-05-07 ENCOUNTER — Telehealth: Payer: Self-pay | Admitting: Family Medicine

## 2019-05-07 NOTE — Telephone Encounter (Signed)
Patinet was informed of bone density report

## 2019-05-07 NOTE — Telephone Encounter (Signed)
Patient is calling and would like her bone density report.

## 2019-05-07 NOTE — Telephone Encounter (Signed)
-----   Message from Maryruth Hancock, MD sent at 05/06/2019 12:09 PM EDT ----- Bone Density normal

## 2019-05-07 NOTE — Telephone Encounter (Signed)
Patient was informed bone density test was normal

## 2019-05-09 ENCOUNTER — Ambulatory Visit: Payer: 59 | Admitting: Family Medicine

## 2019-05-15 ENCOUNTER — Ambulatory Visit (INDEPENDENT_AMBULATORY_CARE_PROVIDER_SITE_OTHER): Payer: 59 | Admitting: Family Medicine

## 2019-05-15 ENCOUNTER — Other Ambulatory Visit: Payer: Self-pay

## 2019-05-15 ENCOUNTER — Encounter: Payer: Self-pay | Admitting: Family Medicine

## 2019-05-15 VITALS — HR 59 | Temp 96.9°F | Resp 15 | Ht 67.0 in | Wt 232.0 lb

## 2019-05-15 DIAGNOSIS — M79622 Pain in left upper arm: Secondary | ICD-10-CM

## 2019-05-15 DIAGNOSIS — Z124 Encounter for screening for malignant neoplasm of cervix: Secondary | ICD-10-CM | POA: Diagnosis not present

## 2019-05-15 DIAGNOSIS — Z1231 Encounter for screening mammogram for malignant neoplasm of breast: Secondary | ICD-10-CM

## 2019-05-15 DIAGNOSIS — Z1211 Encounter for screening for malignant neoplasm of colon: Secondary | ICD-10-CM

## 2019-05-15 HISTORY — DX: Pain in left upper arm: M79.622

## 2019-05-15 MED ORDER — DICLOFENAC SODIUM 25 MG PO TBEC: 25.0000 mg | DELAYED_RELEASE_TABLET | Freq: Two times a day (BID) | ORAL | 0 refills | Status: DC

## 2019-05-15 NOTE — Assessment & Plan Note (Signed)
Pain in left arm and shoulder secondary to fall.  Questionable impingement syndrome versus contusion.  Has some limited range of motion.  Overall is able to rotate and lift arm.  But has tenderness with Hawkins and some bone tenderness with palpitation.  Referral to Ortho as needed.  Currently doing a referral to physical therapy and provided with some Voltaren to see if that can help get pain under control if inflamed.

## 2019-05-15 NOTE — Progress Notes (Signed)
Subjective:  Patient ID: Tonya Nelson, female    DOB: 01-21-1956  Age: 64 y.o. MRN: [id number]  CC:  Chief Complaint  Patient presents with  . Establish Care  . Fall    March 2nd she fell and now she is having pain in left upper arm       HPI  HPI Tonya Nelson is a 64 year old female patient who presents today to establish care.  Previously a patient of Dr. Charlestine Massed and prior to that patient Dr. Luan Pulling.  She presents today to establish care in addition to the fact that she had a fall back on March 2.  Landed on her left shoulder and upper arm.  And reports that as she is been feeling worse on her arm and slowly getting better but overall she is just having difficulty with it.  Reports that pain comes and goes.  Cold makes it ache.  Cannot lay on it she reports that aggravates it.  Does think it is worse at night.  Scale of 4 out of 10 when it is tender.  Has not tried any modifying factors.  Is open to going physical therapy.  And taking inflammatory and if needed referral to Ortho.   Today patient denies signs and symptoms of COVID 19 infection including fever, chills, cough, shortness of breath, and headache. Past Medical, Surgical, Social History, Allergies, and Medications have been Reviewed.   Past Medical History:  Diagnosis Date  . Asthma   . Cellulitis    in toe on left foot  . Endometrial polyp 12/2014  . Essential hypertension   . PMB (postmenopausal bleeding) 11/18/2014  . Porokeratosis   . Thickened endometrium 05/23/2017   Will get endo bx    Current Meds  Medication Sig  . bisoprolol-hydrochlorothiazide (ZIAC) 10-6.25 MG tablet Take 1 tablet by mouth daily.  . cholecalciferol (VITAMIN D) 1000 units tablet Take 2,000 Units by mouth daily.  . furosemide (LASIX) 40 MG tablet Take 1 tablet (40 mg total) by mouth daily.    ROS:  Review of Systems  Musculoskeletal: Positive for joint pain.  All other systems reviewed and are negative.    Objective:    Today's Vitals: Pulse (!) 59   Temp (!) 96.9 F (36.1 C) (Temporal)   Resp 15   Ht 5\' 7"  (1.702 m)   Wt 232 lb (105.2 kg)   BMI 36.34 kg/m  Vitals with BMI 05/15/2019 04/30/2019 04/18/2019  Height 5\' 7"  5\' 7"  5\' 7"   Weight 232 lbs 233 lbs 232 lbs  BMI 36.33 0000000 [id number]  Systolic - 123456 [id number]  Diastolic - 75 79  Pulse 59 54 55     Physical Exam Vitals and nursing note reviewed.  Constitutional:      Appearance: Normal appearance. She is obese.  HENT:     Head: Normocephalic and atraumatic.     Right Ear: External ear normal.     Left Ear: External ear normal.     Mouth/Throat:     Comments: Mask in place  Eyes:     General:        Right eye: No discharge.        Left eye: No discharge.     Conjunctiva/sclera: Conjunctivae normal.  Cardiovascular:     Rate and Rhythm: Normal rate and regular rhythm.     Pulses: Normal pulses.     Heart sounds: Normal heart sounds.  Pulmonary:     Effort: Pulmonary effort is normal.  Breath sounds: Normal breath sounds.  Musculoskeletal:     Right shoulder: Normal.     Left shoulder: Tenderness and bony tenderness present.     Right upper arm: Normal.     Left upper arm: Tenderness present.     Cervical back: Normal range of motion and neck supple.     Comments: Positive Hawkins, questionable as to whether or not true impingement as she has a difficult time with rotation of her shoulder in general with limited changes in her range of motion.  Inability to complete liftoff test.  Abduction and external and internal rotation appear to be normal.  Negative Jobe test.  Skin:    General: Skin is warm.  Neurological:     General: No focal deficit present.     Mental Status: She is alert and oriented to person, place, and time.  Psychiatric:        Mood and Affect: Mood normal.        Behavior: Behavior normal.        Thought Content: Thought content normal.        Judgment: Judgment normal.      Assessment   1. Pain in left upper  arm   2. Encounter for screening mammogram for malignant neoplasm of breast   3. Encounter for screening for malignant neoplasm of cervix   4. Encounter for screening for malignant neoplasm of colon     Tests ordered Orders Placed This Encounter  Procedures  . MM 3D SCREEN BREAST BILATERAL  . Cologuard  . Ambulatory referral to Obstetrics / Gynecology  . Ambulatory referral to Physical Therapy     Plan: Please see assessment and plan per problem list above.   Meds ordered this encounter  Medications  . diclofenac (VOLTAREN) 25 MG EC tablet    Sig: Take 1 tablet (25 mg total) by mouth 2 (two) times daily.    Dispense:  30 tablet    Refill:  0    Order Specific Question:   Supervising Provider    Answer:   Fayrene Helper [id number]    Patient to follow-up in 8 weeks   Perlie Mayo, NP

## 2019-05-15 NOTE — Patient Instructions (Signed)
I appreciate the opportunity to provide you with care for your health and wellness. Today we discussed: establish care and shoulder/arm pain  Follow up: 8 weeks for shoulder   No labs   Referrals for PT, GYN, and Mammogram and Cologuard ordered  Please continue to practice social distancing to keep you, your family, and our community safe.  If you must go out, please wear a mask and practice good handwashing.  It was a pleasure to see you and I look forward to continuing to work together on your health and well-being. Please do not hesitate to call the office if you need care or have questions about your care.  Have a wonderful day and week. With Gratitude, Cherly Beach, DNP, AGNP-BC

## 2019-05-20 ENCOUNTER — Ambulatory Visit (HOSPITAL_COMMUNITY): Payer: 59 | Attending: Family Medicine

## 2019-05-20 ENCOUNTER — Encounter (HOSPITAL_COMMUNITY): Payer: Self-pay

## 2019-05-20 ENCOUNTER — Other Ambulatory Visit: Payer: Self-pay

## 2019-05-20 DIAGNOSIS — M25512 Pain in left shoulder: Secondary | ICD-10-CM | POA: Diagnosis present

## 2019-05-20 DIAGNOSIS — R29898 Other symptoms and signs involving the musculoskeletal system: Secondary | ICD-10-CM | POA: Diagnosis present

## 2019-05-20 DIAGNOSIS — M25612 Stiffness of left shoulder, not elsewhere classified: Secondary | ICD-10-CM | POA: Diagnosis present

## 2019-05-20 NOTE — Patient Instructions (Signed)
Complete the following exercises 2-3 times a day.  1) Seated Row   Sit up straight with elbows by your sides. Pull back with shoulders/elbows, keeping forearms straight, as if pulling back on the reins of a horse. Squeeze shoulder blades together. Repeat _10-15__times, __2-3__sets/day    2) Shoulder Extension    Sit up straight with both arms by your side, draw your arms back behind your waist. Keep your elbows straight. Repeat __10-15__times, __2-3__sets/day.         1) SHOULDER: Flexion On Table   Place hands on towel placed on table, elbows straight. Lean forward with you upper body, pushing towel away from body.  _10-15__ reps per set, _1__ sets per day  2) Abduction (Passive)   With arm out to side, resting on towel placed on table with palm DOWN, keeping trunk away from table, lean to the side while pushing towel away from body.  Repeat _10-15___ times. Do _1___ sessions per day.  Copyright  VHI. All rights reserved.     3) Internal Rotation (Assistive)   Seated with elbow bent at right angle and held against side, slide arm on table surface in an inward arc keeping elbow anchored in place. Repeat _10-15___ times. Do _1___ sessions per day. Activity: Use this motion to brush crumbs off the table.  Copyright  VHI. All rights reserved.

## 2019-05-20 NOTE — Therapy (Signed)
[place], [place], [address] Phone: [phone]   Fax:  [phone]  Occupational Therapy Evaluation  Patient Details  Name: Tonya Nelson MRN: [id number] Date of Birth: Dec 12, 1955 Referring Provider (OT): Cherly Beach, NP   Encounter Date: 05/20/2019  OT End of Session - 05/20/19 1048    Visit Number  1    Number of Visits  16    Date for OT Re-Evaluation  07/15/19    Authorization Type  [employer]    Authorization Time Period  Needs authorization. 30 visit limit    Authorization - Visit Number  1    Authorization - Number of Visits  30    OT Start Time  0945    OT Stop Time  1023    OT Time Calculation (min)  38 min    Activity Tolerance  Patient tolerated treatment well;Patient limited by pain    Behavior During Therapy  WFL for tasks assessed/performed       Past Medical History:  Diagnosis Date  . Asthma   . Cellulitis    in toe on left foot  . Endometrial polyp 12/2014  . Essential hypertension   . PMB (postmenopausal bleeding) 11/18/2014  . Porokeratosis   . Thickened endometrium 05/23/2017   Will get endo bx    Past Surgical History:  Procedure Laterality Date  . WISDOM TOOTH EXTRACTION      There were no vitals filed for this visit.  Subjective Assessment - 05/20/19 0951    Subjective   S: I don't remember what caused me to fall. I tried to catch myself before I went down.    Pertinent History  Patien tis a 64 y/o female S/P left shoulder pain sustained from a fall on April 16, 2019. Patient reports increased pain and difficulty with movement and use during the day and unable to sleep on her shoulder at night. Wandra Arthurs, NP has referred patient to occupational therapy for evaluation and treatment.    Patient Stated Goals  To be able to use her arm better and know what she can do to help it.    Currently in Pain?  Yes    Pain Score  3     Pain Location  Shoulder    Pain Orientation   Left    Pain Descriptors / Indicators  Aching    Pain Type  Acute pain    Pain Radiating Towards  N/A    Pain Onset  More than a month ago    Pain Frequency  Occasional    Aggravating Factors   trying to sleep on it, weight lifting (groceries), movement    Pain Relieving Factors  Pain medication    Effect of Pain on Daily Activities  Severe effect    Multiple Pain Sites  No        [facility] OT Assessment - 05/20/19 0953      Assessment   Medical Diagnosis  Left shoulder pain    Referring Provider (OT)  Cherly Beach, NP    Onset Date/Surgical Date  04/16/19    Hand Dominance  Right    Next MD Visit  07/11/19    Prior Therapy  None      Precautions   Precautions  None      Restrictions   Weight Bearing Restrictions  No      Balance Screen   Has the patient fallen in the past 6 months  Yes  How many times?  1    Has the patient had a decrease in activity level because of a fear of falling?   No    Is the patient reluctant to leave their home because of a fear of falling?   No      Home  Environment   Family/patient expects to be discharged to:  Private residence      Prior Function   Level of [place]  Unemployed    Leisure  Patient enjoys listening to live music (pre-COVID) and gardening.      ADL   ADL comments  Difficulty with carrying in groceries, cooking with heavy pans, upper body dressing (getting coat on and off)      Mobility   Mobility Status  Independent      Written Expression   Dominant Hand  Right      Vision - History   Baseline Vision  Wears glasses only for reading      Observation/Other Assessments   Focus on Therapeutic Outcomes (FOTO)   Next session      ROM / Strength   AROM / PROM / Strength  Strength;PROM;AROM      Palpation   Palpation comment  Max fascial restrictions and tenderness palpated in the left upper arm, trapezius, and scapularis region.      AROM   Overall AROM Comments  Assessed seated. IR/er  adducted    AROM Assessment Site  Shoulder    Right/Left Shoulder  Left    Left Shoulder Flexion  70 Degrees    Left Shoulder ABduction  76 Degrees    Left Shoulder Internal Rotation  90 Degrees    Left Shoulder External Rotation  52 Degrees      PROM   Overall PROM Comments  Assessed supine. IR/er adducted    PROM Assessment Site  Shoulder    Right/Left Shoulder  Left    Left Shoulder Flexion  90 Degrees    Left Shoulder ABduction  110 Degrees    Left Shoulder Internal Rotation  90 Degrees    Left Shoulder External Rotation  50 Degrees      Strength   Overall Strength Comments  Assessed seated. IR/er adducted    Strength Assessment Site  Shoulder    Right/Left Shoulder  Left    Left Shoulder Flexion  3-/5    Left Shoulder ABduction  3-/5    Left Shoulder Internal Rotation  3/5    Left Shoulder External Rotation  3-/5                      OT Education - 05/20/19 1048    Education Details  Table slides, scapular A/ROM extension and row    Person(s) Educated  Patient    Methods  Explanation;Demonstration;Verbal cues;Handout    Comprehension  Verbal cues required;Returned demonstration;Verbalized understanding       OT Short Term Goals - 05/20/19 1052      OT SHORT TERM GOAL #1   Title  Patient will be educated and independent with HEP in order to faciliate her progress in therapy and allow her to utilize her LUE for daily tasks 75% or more of the time.    Time  4    Period  Weeks    Status  New    Target Date  06/17/19      OT SHORT TERM GOAL #2   Title  Patient will increase P/ROM  of her LUE to [facility] in order to increase ability to complete upper body dressing tasks with less difficulty and compensatory movements.    Time  4    Period  Weeks    Status  New      OT SHORT TERM GOAL #3   Title  Patient will report a pain level of approximately 6/10 or less during functional use of her LUE.    Time  4    Period  Weeks    Status  New      OT SHORT TERM  GOAL #4   Title  Patient will increase her LUE strength to 3+/5 in order to utilize it for light household activities requiring shoulder level reaching.    Time  4    Period  Weeks    Status  New      OT SHORT TERM GOAL #5   Title  Patient will decrease her LUE fascial restrictions to mod amount or less in order to increase her functional mobility needed for reaching tasks.    Time  4    Period  Weeks    Status  New        OT Long Term Goals - 05/20/19 1054      OT LONG TERM GOAL #1   Title  Patient will return to use her LUE as her non-dominant extremity for all daily tasks without need for compensatory strategies due to pain level.    Time  8    Period  Weeks    Status  New    Target Date  07/15/19      OT LONG TERM GOAL #2   Title  Patient will increase her LUE A/ROM to [facility] in order to complete reaching tasks above shoulder level.    Time  8    Period  Weeks    Status  New      OT LONG TERM GOAL #3   Title  Patient will increase her LUE strength to 4+/5 in order to return to carrying grocery bags using her LUE.    Time  8    Period  Weeks    Status  New      OT LONG TERM GOAL #4   Title  Patient will decrease her LUE pain to 3/10 or less while completing functional daily tasks.    Time  8    Period  Weeks    Status  New      OT LONG TERM GOAL #5   Title  Patient will decrease her LUE fascial restrictions to Min amount or less in order to increase the functional mobility needed for overhead reaching tasks.    Time  8    Period  Weeks    Status  New            Plan - 05/20/19 1049    Clinical Impression Statement  A: Patient is a 64 y/o female S/P left shoulder pain causing increased fascial restrictions, pain, and decreased ROM and strength resuling in difficulty completing daily tasks using her LUE as her non-dominant extremity.    OT Occupational Profile and History  Problem Focused Assessment - Including review of records relating to presenting problem     Occupational performance deficits (Please refer to evaluation for details):  Rest and Sleep;ADL's;IADL's;Leisure    Body Structure / Function / Physical Skills  ADL;UE functional use;Fascial restriction;Pain;ROM;Strength    Rehab Potential  Good    Clinical Decision Making  Several treatment options, min-mod task modification necessary    Comorbidities Affecting Occupational Performance:  May have comorbidities impacting occupational performance    Modification or Assistance to Complete Evaluation   Min-Moderate modification of tasks or assist with assess necessary to complete eval    OT Frequency  2x / week    OT Duration  8 weeks    OT Treatment/Interventions  Self-care/ADL training;Ultrasound;DME and/or AE instruction;Patient/family education;Passive range of motion;Cryotherapy;Electrical Stimulation;Moist Heat;Neuromuscular education;Therapeutic exercise;Manual Therapy;Therapeutic activities;Coping strategies training    Plan  P: Patient will benefit from skilled OT services to increase functional performance during daily tasks and ability to sleep more comfortably. Treatment plan: Myofascial release, manual stretching, P/ROM, AA/ROM, A/ROM, general strengthening. Modalities PRN.    OT Home Exercise Plan  Eval: table slide, A/ROM scapula -extension, row.    Consulted and Agree with Plan of Care  Patient       Patient will benefit from skilled therapeutic intervention in order to improve the following deficits and impairments:   Body Structure / Function / Physical Skills: ADL, UE functional use, Fascial restriction, Pain, ROM, Strength       Visit Diagnosis: Other symptoms and signs involving the musculoskeletal system - Plan: Ot plan of care cert/re-cert  Acute pain of left shoulder - Plan: Ot plan of care cert/re-cert  Stiffness of left shoulder, not elsewhere classified - Plan: Ot plan of care cert/re-cert    Problem List Patient Active Problem List   Diagnosis Date Noted   . Pain in left upper arm 05/15/2019  . Elevated glucose 04/30/2019  . Essential hypertension 04/18/2019  . Osteoporosis 04/18/2019  . Thickened endometrium 05/23/2017  . PMB (postmenopausal bleeding) 11/18/2014   Ailene Ravel, OTR/L,CBIS  [phone]  05/20/2019, 10:58 AM  [facility] [address] [place], [place], [address] Phone: [phone]   Fax:  [phone]  Name: ELDEAN GOUGHNOUR MRN: [id number] Date of Birth: 1955-04-27

## 2019-05-22 ENCOUNTER — Ambulatory Visit (HOSPITAL_COMMUNITY): Payer: 59

## 2019-05-22 ENCOUNTER — Encounter (HOSPITAL_COMMUNITY): Payer: Self-pay

## 2019-05-22 ENCOUNTER — Other Ambulatory Visit: Payer: Self-pay

## 2019-05-22 DIAGNOSIS — M25512 Pain in left shoulder: Secondary | ICD-10-CM

## 2019-05-22 DIAGNOSIS — R29898 Other symptoms and signs involving the musculoskeletal system: Secondary | ICD-10-CM | POA: Diagnosis not present

## 2019-05-22 DIAGNOSIS — M25612 Stiffness of left shoulder, not elsewhere classified: Secondary | ICD-10-CM

## 2019-05-22 NOTE — Patient Instructions (Signed)
Repeat all exercises 10-15 times, 1-2 times per day.  1) Shoulder Protraction    Begin with elbows by your side, slowly "punch" straight out in front of you.      2) Shoulder Flexion  Standing:         Begin with arms at your side with thumbs pointed up, slowly raise both arms up and forward towards overhead.       3) Horizontal abduction/adduction   Standing:           Begin with arms straight out in front of you, bring out to the side in at "T" shape. Keep arms straight entire time.             4) Internal & External Rotation   Standing:     Stand with elbows at the side and elbows bent 90 degrees. Move your forearms away from your body, then bring back inward toward the body.     5) Shoulder Abduction   Standing:       Begin with your arms flat next to your side. Slowly move your arms out to the side so that they go overhead, in a jumping jack or snow angel movement.       

## 2019-05-22 NOTE — Therapy (Signed)
[place], [place], [address] Phone: [phone]   Fax:  [phone]  Occupational Therapy Treatment  Patient Details  Name: Tonya Nelson MRN: [id number] Date of Birth: 15-Aug-1955 Referring Provider (OT): Cherly Beach, NP   Encounter Date: 05/22/2019  OT End of Session - 05/22/19 1054    Visit Number  2    Number of Visits  16    Date for OT Re-Evaluation  07/15/19    Authorization Type  [employer]    Authorization Time Period  Needs authorization. 30 visit limit    Authorization - Visit Number  2    Authorization - Number of Visits  30    OT Start Time  1030    OT Stop Time  1108    OT Time Calculation (min)  38 min    Activity Tolerance  Patient tolerated treatment well;Patient limited by pain    Behavior During Therapy  WFL for tasks assessed/performed       Past Medical History:  Diagnosis Date  . Asthma   . Cellulitis    in toe on left foot  . Endometrial polyp 12/2014  . Essential hypertension   . PMB (postmenopausal bleeding) 11/18/2014  . Porokeratosis   . Thickened endometrium 05/23/2017   Will get endo bx    Past Surgical History:  Procedure Laterality Date  . WISDOM TOOTH EXTRACTION      There were no vitals filed for this visit.  Subjective Assessment - 05/22/19 1052    Subjective   S: Nothing new to report.    Currently in Pain?  No/denies         [facility] OT Assessment - 05/22/19 1043      Assessment   Medical Diagnosis  Left shoulder pain      Precautions   Precautions  None      Observation/Other Assessments   Focus on Therapeutic Outcomes (FOTO)   57/100               OT Treatments/Exercises (OP) - 05/22/19 1044      Exercises   Exercises  Shoulder      Shoulder Exercises: Supine   Protraction  PROM;5 reps;AROM;10 reps    Horizontal ABduction  PROM;5 reps;AROM;10 reps    External Rotation  PROM;5 reps;AROM;10 reps    Internal Rotation  PROM;5  reps;AROM;10 reps    Flexion  PROM;5 reps;AROM;10 reps    ABduction  PROM;5 reps;AROM;10 reps      Shoulder Exercises: Standing   Protraction  AROM;10 reps    Horizontal ABduction  AROM;10 reps    External Rotation  AROM;10 reps    Internal Rotation  AROM;10 reps    Flexion  AROM;10 reps    ABduction  AROM;10 reps;Limitations    ABduction Limitations  to shoulder level      Manual Therapy   Manual Therapy  Myofascial release    Manual therapy comments  Manual therapy completed prior to exercises.     Myofascial Release  Myofasical release and manual stretching complete to left upper arm, trapezius, and scapularis region.              OT Education - 05/22/19 1102    Education Details  reviewed therapy goals. Updated HEP: standing A/ROM exercises.    Person(s) Educated  Patient    Methods  Explanation;Demonstration;Verbal cues;Handout    Comprehension  Verbal cues required;Returned demonstration;Verbalized understanding       OT  Short Term Goals - 05/22/19 1043      OT SHORT TERM GOAL #1   Title  Patient will be educated and independent with HEP in order to faciliate her progress in therapy and allow her to utilize her LUE for daily tasks 75% or more of the time.    Time  4    Period  Weeks    Status  On-going    Target Date  06/17/19      OT SHORT TERM GOAL #2   Title  Patient will increase P/ROM of her LUE to [facility] in order to increase ability to complete upper body dressing tasks with less difficulty and compensatory movements.    Time  4    Period  Weeks    Status  On-going      OT SHORT TERM GOAL #3   Title  Patient will report a pain level of approximately 6/10 or less during functional use of her LUE.    Time  4    Period  Weeks    Status  On-going      OT SHORT TERM GOAL #4   Title  Patient will increase her LUE strength to 3+/5 in order to utilize it for light household activities requiring shoulder level reaching.    Time  4    Period  Weeks    Status   On-going      OT SHORT TERM GOAL #5   Title  Patient will decrease her LUE fascial restrictions to mod amount or less in order to increase her functional mobility needed for reaching tasks.    Time  4    Period  Weeks    Status  On-going        OT Long Term Goals - 05/22/19 1041      OT LONG TERM GOAL #1   Title  Patient will return to use her LUE as her non-dominant extremity for all daily tasks without need for compensatory strategies due to pain level.    Time  8    Period  Weeks    Status  On-going      OT LONG TERM GOAL #2   Title  Patient will increase her LUE A/ROM to [facility] in order to complete reaching tasks above shoulder level.    Time  8    Period  Weeks    Status  On-going      OT LONG TERM GOAL #3   Title  Patient will increase her LUE strength to 4+/5 in order to return to carrying grocery bags using her LUE.    Time  8    Period  Weeks    Status  On-going      OT LONG TERM GOAL #4   Title  Patient will decrease her LUE pain to 3/10 or less while completing functional daily tasks.    Time  8    Period  Weeks    Status  On-going      OT LONG TERM GOAL #5   Title  Patient will decrease her LUE fascial restrictions to Min amount or less in order to increase the functional mobility needed for overhead reaching tasks.    Time  8    Period  Weeks    Status  On-going            Plan - 05/22/19 1242    Clinical Impression Statement  A: completed FOTO and reviewed goals for therapy. Initiated myofascial release, manual  stretching, and A/ROM. patient had limited tolerance to myofascial release although no change in fascial expressions were displayed. Did not voice that her arm was sore until asked. VC for form and technique were provided during session. More difficulty with form during external rotation. Updated HEP for A/ROM exercises.    Body Structure / Function / Physical Skills  ADL;UE functional use;Fascial restriction;Pain;ROM;Strength    Plan  P: Follow  up on exercises. Complete PVC pipe slide and wall wash.    OT Home Exercise Plan  Eval: table slide, A/ROM scapula -extension, row. 4/7: A/ROM shoulder standing.    Consulted and Agree with Plan of Care  Patient       Patient will benefit from skilled therapeutic intervention in order to improve the following deficits and impairments:   Body Structure / Function / Physical Skills: ADL, UE functional use, Fascial restriction, Pain, ROM, Strength       Visit Diagnosis: Other symptoms and signs involving the musculoskeletal system  Acute pain of left shoulder  Stiffness of left shoulder, not elsewhere classified    Problem List Patient Active Problem List   Diagnosis Date Noted  . Pain in left upper arm 05/15/2019  . Elevated glucose 04/30/2019  . Essential hypertension 04/18/2019  . Osteoporosis 04/18/2019  . Thickened endometrium 05/23/2017  . PMB (postmenopausal bleeding) 11/18/2014   Ailene Ravel, OTR/L,CBIS  [phone]  05/22/2019, 12:44 PM  [facility] [address] [place], [place], [address] Phone: [phone]   Fax:  [phone]  Name: EMRA LIVSHITS MRN: [id number] Date of Birth: 06/13/1955

## 2019-05-27 ENCOUNTER — Ambulatory Visit (HOSPITAL_COMMUNITY): Payer: 59

## 2019-05-27 ENCOUNTER — Encounter (HOSPITAL_COMMUNITY): Payer: Self-pay

## 2019-05-27 ENCOUNTER — Other Ambulatory Visit: Payer: Self-pay

## 2019-05-27 DIAGNOSIS — M25512 Pain in left shoulder: Secondary | ICD-10-CM

## 2019-05-27 DIAGNOSIS — R29898 Other symptoms and signs involving the musculoskeletal system: Secondary | ICD-10-CM

## 2019-05-27 DIAGNOSIS — M25612 Stiffness of left shoulder, not elsewhere classified: Secondary | ICD-10-CM

## 2019-05-27 NOTE — Therapy (Signed)
[place], [place], [address] Phone: [phone]   Fax:  [phone]  Occupational Therapy Treatment  Patient Details  Name: Tonya Nelson MRN: [id number] Date of Birth: 02-08-1956 Referring Provider (OT): Cherly Beach, NP   Encounter Date: 05/27/2019  OT End of Session - 05/27/19 1113    Visit Number  3    Number of Visits  16    Date for OT Re-Evaluation  07/15/19    Authorization Type  [employer]    Authorization Time Period  Needs authorization. 30 visit limit    Authorization - Visit Number  3    Authorization - Number of Visits  30    OT Start Time  [id number]   Pt did not show that she was checked in   OT Stop Time  1110    OT Time Calculation (min)  30 min    Activity Tolerance  Patient tolerated treatment well    Behavior During Therapy  [facility] for tasks assessed/performed       Past Medical History:  Diagnosis Date  . Asthma   . Cellulitis    in toe on left foot  . Endometrial polyp 12/2014  . Essential hypertension   . PMB (postmenopausal bleeding) 11/18/2014  . Porokeratosis   . Thickened endometrium 05/23/2017   Will get endo bx    Past Surgical History:  Procedure Laterality Date  . WISDOM TOOTH EXTRACTION      There were no vitals filed for this visit.      [facility] OT Assessment - 05/27/19 1053      Assessment   Medical Diagnosis  Left shoulder pain      Precautions   Precautions  None               OT Treatments/Exercises (OP) - 05/27/19 1052      Exercises   Exercises  Shoulder      Shoulder Exercises: Supine   Protraction  PROM;5 reps    Horizontal ABduction  PROM;5 reps    External Rotation  PROM;5 reps    Internal Rotation  PROM;5 reps    Flexion  PROM;5 reps    ABduction  PROM;5 reps      Shoulder Exercises: Standing   Protraction  AROM;10 reps    Horizontal ABduction  AROM;10 reps    External Rotation  AROM;10 reps    Internal Rotation  AROM;10 reps    Flexion  AROM;10 reps    ABduction  AROM;10 reps;Limitations      Shoulder Exercises: ROM/Strengthening   UBE (Upper Arm Bike)  Level 1 2' forward 2' reverse    pace: 5.0-6.0   Wall Wash  1'    Over Head Lace  2' seated    Other ROM/Strengthening Exercises  PVC pipe slide; 10X      Manual Therapy   Manual Therapy  Myofascial release    Manual therapy comments  Manual therapy completed prior to exercises.     Myofascial Release  Myofasical release and manual stretching complete to left upper arm, trapezius, and scapularis region.                OT Short Term Goals - 05/22/19 1043      OT SHORT TERM GOAL #1   Title  Patient will be educated and independent with HEP in order to faciliate her progress in therapy and allow her to utilize her LUE for daily tasks 75% or more  of the time.    Time  4    Period  Weeks    Status  On-going    Target Date  06/17/19      OT SHORT TERM GOAL #2   Title  Patient will increase P/ROM of her LUE to [facility] in order to increase ability to complete upper body dressing tasks with less difficulty and compensatory movements.    Time  4    Period  Weeks    Status  On-going      OT SHORT TERM GOAL #3   Title  Patient will report a pain level of approximately 6/10 or less during functional use of her LUE.    Time  4    Period  Weeks    Status  On-going      OT SHORT TERM GOAL #4   Title  Patient will increase her LUE strength to 3+/5 in order to utilize it for light household activities requiring shoulder level reaching.    Time  4    Period  Weeks    Status  On-going      OT SHORT TERM GOAL #5   Title  Patient will decrease her LUE fascial restrictions to mod amount or less in order to increase her functional mobility needed for reaching tasks.    Time  4    Period  Weeks    Status  On-going        OT Long Term Goals - 05/22/19 1041      OT LONG TERM GOAL #1   Title  Patient will return to use her LUE as her non-dominant extremity  for all daily tasks without need for compensatory strategies due to pain level.    Time  8    Period  Weeks    Status  On-going      OT LONG TERM GOAL #2   Title  Patient will increase her LUE A/ROM to [facility] in order to complete reaching tasks above shoulder level.    Time  8    Period  Weeks    Status  On-going      OT LONG TERM GOAL #3   Title  Patient will increase her LUE strength to 4+/5 in order to return to carrying grocery bags using her LUE.    Time  8    Period  Weeks    Status  On-going      OT LONG TERM GOAL #4   Title  Patient will decrease her LUE pain to 3/10 or less while completing functional daily tasks.    Time  8    Period  Weeks    Status  On-going      OT LONG TERM GOAL #5   Title  Patient will decrease her LUE fascial restrictions to Min amount or less in order to increase the functional mobility needed for overhead reaching tasks.    Time  8    Period  Weeks    Status  On-going            Plan - 05/27/19 1114    Clinical Impression Statement  A: Patient demonstrates more A/ROM this session. Required VC to relax her arm during passive stretching as she presented with muscle guarding at end stretch. VC for fom and technique were provided during exercises. Tenderness palpated in left anterior deltoid region and unable to tolerate manual therapy in this region. Added pvc pipe slide, wall wash, overhead lacing, and UBE bike  this session to further increase shoulder stability and endurance.    Body Structure / Function / Physical Skills  ADL;UE functional use;Fascial restriction;Pain;ROM;Strength    Plan  P: Increase A/ROM repetitions. Attempt therapy ball strengthening. Add proximal shoulder strengthening at door.    Consulted and Agree with Plan of Care  Patient       Patient will benefit from skilled therapeutic intervention in order to improve the following deficits and impairments:   Body Structure / Function / Physical Skills: ADL, UE functional use,  Fascial restriction, Pain, ROM, Strength       Visit Diagnosis: Acute pain of left shoulder  Stiffness of left shoulder, not elsewhere classified  Other symptoms and signs involving the musculoskeletal system    Problem List Patient Active Problem List   Diagnosis Date Noted  . Pain in left upper arm 05/15/2019  . Elevated glucose 04/30/2019  . Essential hypertension 04/18/2019  . Osteoporosis 04/18/2019  . Thickened endometrium 05/23/2017  . PMB (postmenopausal bleeding) 11/18/2014   Ailene Ravel, OTR/L,CBIS  [phone]  05/27/2019, 11:41 AM  [facility] [address] [place], [place], [address] Phone: [phone]   Fax:  [phone]  Name: Tonya Nelson MRN: [id number] Date of Birth: [birth year]

## 2019-05-29 ENCOUNTER — Ambulatory Visit (HOSPITAL_COMMUNITY): Payer: 59 | Admitting: Occupational Therapy

## 2019-06-03 ENCOUNTER — Telehealth: Payer: Self-pay | Admitting: Adult Health

## 2019-06-03 ENCOUNTER — Ambulatory Visit (HOSPITAL_COMMUNITY): Payer: 59

## 2019-06-03 ENCOUNTER — Telehealth (HOSPITAL_COMMUNITY): Payer: Self-pay

## 2019-06-03 NOTE — Telephone Encounter (Signed)

## 2019-06-03 NOTE — Telephone Encounter (Signed)
the pt called to cx due to she has to take her son to a doctor's appt.

## 2019-06-04 ENCOUNTER — Ambulatory Visit (INDEPENDENT_AMBULATORY_CARE_PROVIDER_SITE_OTHER): Payer: 59 | Admitting: Adult Health

## 2019-06-04 ENCOUNTER — Encounter: Payer: Self-pay | Admitting: Adult Health

## 2019-06-04 ENCOUNTER — Other Ambulatory Visit (HOSPITAL_COMMUNITY)
Admission: RE | Admit: 2019-06-04 | Discharge: 2019-06-04 | Disposition: A | Discharge status: Home / Self Care | Payer: 59 | Source: Ambulatory Visit | Attending: Adult Health | Admitting: Adult Health

## 2019-06-04 ENCOUNTER — Other Ambulatory Visit: Payer: Self-pay

## 2019-06-04 VITALS — BP 161/79 | HR 67 | Ht 65.25 in | Wt 232.0 lb

## 2019-06-04 DIAGNOSIS — B369 Superficial mycosis, unspecified: Secondary | ICD-10-CM

## 2019-06-04 DIAGNOSIS — R195 Other fecal abnormalities: Secondary | ICD-10-CM | POA: Diagnosis not present

## 2019-06-04 DIAGNOSIS — Z1272 Encounter for screening for malignant neoplasm of vagina: Secondary | ICD-10-CM

## 2019-06-04 DIAGNOSIS — Z01419 Encounter for gynecological examination (general) (routine) without abnormal findings: Secondary | ICD-10-CM | POA: Insufficient documentation

## 2019-06-04 DIAGNOSIS — Z1211 Encounter for screening for malignant neoplasm of colon: Secondary | ICD-10-CM | POA: Diagnosis not present

## 2019-06-04 DIAGNOSIS — K649 Unspecified hemorrhoids: Secondary | ICD-10-CM | POA: Insufficient documentation

## 2019-06-04 DIAGNOSIS — Z1212 Encounter for screening for malignant neoplasm of rectum: Secondary | ICD-10-CM

## 2019-06-04 HISTORY — DX: Superficial mycosis, unspecified: B36.9

## 2019-06-04 LAB — HEMOCCULT GUIAC POC 1CARD (OFFICE): Fecal Occult Blood, POC: POSITIVE — AB

## 2019-06-04 MED ORDER — FLUCONAZOLE 150 MG PO TABS: ORAL_TABLET | ORAL | 1 refills | Status: DC

## 2019-06-04 MED ORDER — NYSTATIN 100000 UNIT/GM EX POWD: 1.0000 "application " | Freq: Three times a day (TID) | CUTANEOUS | 3 refills | Status: DC

## 2019-06-04 NOTE — Progress Notes (Signed)
  Subjective:     Patient ID: Tonya Nelson, female   DOB: 29-Dec-1955, 64 y.o.   MRN: [id number]  HPI Tonya Nelson is a 64 year old white female,widowed, PM in for pelvic and pap. PCP is Cherly Beach NP.   Review of Systems Patient denies any headaches, hearing loss, fatigue, blurred vision, shortness of breath, chest pain, abdominal pain, problems with bowel movements, urination, or intercourse(not active). No  mood swings. Has discomfort in left shoulder from fall in March, is going to PT No vaginal bleeding.  Reviewed past medical,surgical, social and family history. Reviewed medications and allergies.     Objective:   Physical Exam BP (!) 161/79 (BP Location: Left Arm, Patient Position: Sitting, Cuff Size: Large)   Pulse 67   Ht 5' 5.25" (1.657 m)   Wt 232 lb (105.2 kg)   BMI 38.31 kg/m    Skin warm and dry, left groin and under panniculus is red and shiny, like yeast infection. Pelvic: external genitalia is normal in appearance no lesions, vagina: pale with loss of moisture and rugae,urethra has no lesions or masses noted, cervix:smooth, uterus: normal size, shape and contour, non tender, no masses felt, adnexa: no masses or tenderness noted. Bladder is non tender and no masses felt. Rectal exam:has good tone, +hemorrhoid, and hemoccult was positive.  Fall risk is moderate PHQ 2 score is 0. Examination chaperoned by Levy Pupa LPN.  Assessment:     1. Encounter for gynecological examination with Papanicolaou smear of cervix Pap sent Pap in 3 years if normal today Physical with PCP Labs with PCP Mammogram yearly  2. Screening for colorectal cancer She was doing to do cologuard, to wait til after 3 hemoccult cards resulted.  3. Superficial fungus infection of skin Will rx diflucan and nystatin powders Keep clean and dry  Meds ordered this encounter  Medications  . fluconazole (DIFLUCAN) 150 MG tablet    Sig: Take 1 now and repeat 1 in 3 days    Dispense:  2 tablet   Refill:  1    Order Specific Question:   Supervising Provider    Answer:   EURE, LUTHER H [[id number]]  . nystatin (MYCOSTATIN/NYSTOP) powder    Sig: Apply 1 application topically 3 (three) times daily.    Dispense:  45 g    Refill:  3    Order Specific Question:   Supervising Provider    Answer:   EURE, LUTHER H [[id number]]    4. Positive fecal occult blood test Sent home with 3 hemoccult cards to do and bring back   5. Hemorrhoids, unspecified hemorrhoid type    Plan:    Bring hemoccult cards back completed all 3 Pap in 3 years Follow up prn

## 2019-06-05 ENCOUNTER — Encounter (HOSPITAL_COMMUNITY): Payer: Self-pay

## 2019-06-05 ENCOUNTER — Ambulatory Visit (HOSPITAL_COMMUNITY): Payer: 59

## 2019-06-05 DIAGNOSIS — R29898 Other symptoms and signs involving the musculoskeletal system: Secondary | ICD-10-CM | POA: Diagnosis not present

## 2019-06-05 DIAGNOSIS — M25612 Stiffness of left shoulder, not elsewhere classified: Secondary | ICD-10-CM

## 2019-06-05 DIAGNOSIS — M25512 Pain in left shoulder: Secondary | ICD-10-CM

## 2019-06-05 NOTE — Therapy (Signed)
[place], [place], [address] Phone: [phone]   Fax:  [phone]  Occupational Therapy Treatment  Patient Details  Name: Tonya Nelson MRN: [id number] Date of Birth: 1955/07/02 Referring Provider (OT): Cherly Beach, NP   Encounter Date: 06/05/2019  OT End of Session - 06/05/19 1121    Visit Number  4    Number of Visits  16    Date for OT Re-Evaluation  07/15/19    Authorization Type  [employer]    Authorization Time Period  Needs authorization. 30 visit limit    Authorization - Visit Number  4    Authorization - Number of Visits  30    OT Start Time  1030    OT Stop Time  1108    OT Time Calculation (min)  38 min    Activity Tolerance  Patient tolerated treatment well    Behavior During Therapy  WFL for tasks assessed/performed       Past Medical History:  Diagnosis Date  . Asthma   . Cellulitis    in toe on left foot  . Endometrial polyp 12/2014  . Essential hypertension   . PMB (postmenopausal bleeding) 11/18/2014  . Porokeratosis   . Thickened endometrium 05/23/2017   Will get endo bx    Past Surgical History:  Procedure Laterality Date  . WISDOM TOOTH EXTRACTION      There were no vitals filed for this visit.  Subjective Assessment - 06/05/19 1047    Subjective   S: I still can't sleep on it.    Currently in Pain?  Yes    Pain Score  2     Pain Location  Shoulder    Pain Orientation  Left    Pain Descriptors / Indicators  Sore    Pain Type  Acute pain    Pain Radiating Towards  N/A    Pain Onset  More than a month ago    Pain Frequency  Occasional    Aggravating Factors   trying to sleep on it, lifting items, certain movement    Pain Relieving Factors  pain medication    Effect of Pain on Daily Activities  min effect    Multiple Pain Sites  No         [facility] OT Assessment - 06/05/19 1049      Assessment   Medical Diagnosis  Left shoulder pain      Precautions    Precautions  None               OT Treatments/Exercises (OP) - 06/05/19 1049      Exercises   Exercises  Shoulder      Shoulder Exercises: Supine   Protraction  PROM;5 reps    Horizontal ABduction  PROM;5 reps    External Rotation  PROM;5 reps    Internal Rotation  PROM;5 reps    Flexion  PROM;5 reps    ABduction  PROM;5 reps      Shoulder Exercises: Standing   Extension  Theraband;10 reps    Theraband Level (Shoulder Extension)  Level 2 (Red)    Row  Theraband;10 reps    Theraband Level (Shoulder Row)  Level 2 (Red)    Retraction  Theraband;10 reps    Theraband Level (Shoulder Retraction)  Level 2 (Red)      Shoulder Exercises: Therapy Ball   Other Therapy Ball Exercises  green therapy ball; 12X, chest press, flexion, circles (left/right)  Shoulder Exercises: ROM/Strengthening   Ball on Wall  1' flexion 1' abduction       Manual Therapy   Manual Therapy  Myofascial release    Manual therapy comments  Manual therapy completed prior to exercises.                OT Short Term Goals - 05/22/19 1043      OT SHORT TERM GOAL #1   Title  Patient will be educated and independent with HEP in order to faciliate her progress in therapy and allow her to utilize her LUE for daily tasks 75% or more of the time.    Time  4    Period  Weeks    Status  On-going    Target Date  06/17/19      OT SHORT TERM GOAL #2   Title  Patient will increase P/ROM of her LUE to [facility] in order to increase ability to complete upper body dressing tasks with less difficulty and compensatory movements.    Time  4    Period  Weeks    Status  On-going      OT SHORT TERM GOAL #3   Title  Patient will report a pain level of approximately 6/10 or less during functional use of her LUE.    Time  4    Period  Weeks    Status  On-going      OT SHORT TERM GOAL #4   Title  Patient will increase her LUE strength to 3+/5 in order to utilize it for light household activities requiring  shoulder level reaching.    Time  4    Period  Weeks    Status  On-going      OT SHORT TERM GOAL #5   Title  Patient will decrease her LUE fascial restrictions to mod amount or less in order to increase her functional mobility needed for reaching tasks.    Time  4    Period  Weeks    Status  On-going        OT Long Term Goals - 05/22/19 1041      OT LONG TERM GOAL #1   Title  Patient will return to use her LUE as her non-dominant extremity for all daily tasks without need for compensatory strategies due to pain level.    Time  8    Period  Weeks    Status  On-going      OT LONG TERM GOAL #2   Title  Patient will increase her LUE A/ROM to [facility] in order to complete reaching tasks above shoulder level.    Time  8    Period  Weeks    Status  On-going      OT LONG TERM GOAL #3   Title  Patient will increase her LUE strength to 4+/5 in order to return to carrying grocery bags using her LUE.    Time  8    Period  Weeks    Status  On-going      OT LONG TERM GOAL #4   Title  Patient will decrease her LUE pain to 3/10 or less while completing functional daily tasks.    Time  8    Period  Weeks    Status  On-going      OT LONG TERM GOAL #5   Title  Patient will decrease her LUE fascial restrictions to Min amount or less in order to increase the functional mobility  needed for overhead reaching tasks.    Time  8    Period  Weeks    Status  On-going            Plan - 06/05/19 1122    Clinical Impression Statement  A: Difficulty with passive stretching due to muscle guarding. Patient continues to have limited tolerance with manual techniques when addressing fascial restrictions. Requires consistant verbal cueing for form and technique due to decreased body awareness and scapular weakness. Able to tolerate therapy ball strengthening and resistance band for scapular strengthening.    Body Structure / Function / Physical Skills  ADL;UE functional use;Fascial  restriction;Pain;ROM;Strength    Plan  P: D/C passive stretching. Complete myofascial release PRN. Begin strengthening. Work on consistant rate with repetitions with less cueing for form and technique. Complete scapular theraband exercises with red band.    Consulted and Agree with Plan of Care  Patient       Patient will benefit from skilled therapeutic intervention in order to improve the following deficits and impairments:   Body Structure / Function / Physical Skills: ADL, UE functional use, Fascial restriction, Pain, ROM, Strength       Visit Diagnosis: Other symptoms and signs involving the musculoskeletal system  Stiffness of left shoulder, not elsewhere classified  Acute pain of left shoulder    Problem List Patient Active Problem List   Diagnosis Date Noted  . Superficial fungus infection of skin 06/04/2019  . Screening for colorectal cancer 06/04/2019  . Encounter for gynecological examination with Papanicolaou smear of cervix 06/04/2019  . Positive fecal occult blood test 06/04/2019  . Hemorrhoids 06/04/2019  . Pain in left upper arm 05/15/2019  . Elevated glucose 04/30/2019  . Essential hypertension 04/18/2019  . Osteoporosis 04/18/2019  . Thickened endometrium 05/23/2017  . PMB (postmenopausal bleeding) 11/18/2014   Ailene Ravel, OTR/L,CBIS  [phone]  06/05/2019, 11:26 AM  [facility] [address] [place], [place], [address] Phone: [phone]   Fax:  [phone]  Name: Tonya Nelson MRN: [id number] Date of Birth: [birth year]

## 2019-06-06 LAB — CYTOLOGY - PAP
Comment: NEGATIVE
Diagnosis: NEGATIVE
High risk HPV: NEGATIVE

## 2019-06-10 ENCOUNTER — Other Ambulatory Visit: Payer: Self-pay

## 2019-06-10 ENCOUNTER — Ambulatory Visit (HOSPITAL_COMMUNITY): Payer: 59

## 2019-06-10 DIAGNOSIS — R29898 Other symptoms and signs involving the musculoskeletal system: Secondary | ICD-10-CM | POA: Diagnosis not present

## 2019-06-10 DIAGNOSIS — M25612 Stiffness of left shoulder, not elsewhere classified: Secondary | ICD-10-CM

## 2019-06-10 DIAGNOSIS — M25512 Pain in left shoulder: Secondary | ICD-10-CM

## 2019-06-10 NOTE — Therapy (Signed)
[place], [place], [address] Phone: [phone]   Fax:  [phone]  Occupational Therapy Treatment  Patient Details  Name: Tonya Nelson MRN: [id number] Date of Birth: [birth year] Referring Provider (OT): Cherly Beach, NP   Encounter Date: 06/10/2019  OT End of Session - 06/10/19 1110    Visit Number  5    Number of Visits  16    Date for OT Re-Evaluation  07/15/19    Authorization Type  [employer]    Authorization Time Period  Needs authorization. 30 visit limit    Authorization - Visit Number  5    Authorization - Number of Visits  30    OT Start Time  1030    OT Stop Time  1108    OT Time Calculation (min)  38 min    Activity Tolerance  Patient tolerated treatment well    Behavior During Therapy  WFL for tasks assessed/performed       Past Medical History:  Diagnosis Date  . Asthma   . Cellulitis    in toe on left foot  . Endometrial polyp 12/2014  . Essential hypertension   . PMB (postmenopausal bleeding) 11/18/2014  . Porokeratosis   . Thickened endometrium 05/23/2017   Will get endo bx    Past Surgical History:  Procedure Laterality Date  . WISDOM TOOTH EXTRACTION      There were no vitals filed for this visit.      [facility] OT Assessment - 06/10/19 1115      Assessment   Medical Diagnosis  Left shoulder pain      Precautions   Precautions  None               OT Treatments/Exercises (OP) - 06/10/19 1046      Exercises   Exercises  Shoulder      Shoulder Exercises: Standing   Protraction  Strengthening;12 reps    Protraction Weight (lbs)  1    Horizontal ABduction  Strengthening;12 reps    Horizontal ABduction Weight (lbs)  1    External Rotation  Strengthening;12 reps    External Rotation Weight (lbs)  1    Internal Rotation  Strengthening;12 reps    Internal Rotation Weight (lbs)  1    Flexion  Strengthening;12 reps    Shoulder Flexion Weight (lbs)  1    ABduction  Strengthening;12 reps    Shoulder ABduction Weight (lbs)  1    Extension  Theraband;12 reps    Theraband Level (Shoulder Extension)  Level 2 (Red)    Row  Theraband;12 reps    Theraband Level (Shoulder Row)  Level 2 (Red)    Retraction  Theraband;12 reps    Theraband Level (Shoulder Retraction)  Level 2 (Red)      Shoulder Exercises: Therapy Ball   Other Therapy Ball Exercises  green therapy ball; 12X, chest press, flexion, circles (left/right)      Shoulder Exercises: ROM/Strengthening   UBE (Upper Arm Bike)  Level 2 2' reverse 2' forward    pace: 5.0-6.0   X to V Arms  12X with 1#    Proximal Shoulder Strengthening, Seated  1# 12X with no rest breaks     Ball on Wall  1' flexion 1' abduction              OT Education - 06/10/19 1107    Education Details  red band scapular strengthening. informed patient that she may  either hold a 1lb weight, water bottle, or can of soup for her A/ROM exercises.    Person(s) Educated  Patient    Methods  Explanation;Demonstration;Verbal cues;Handout    Comprehension  Verbal cues required;Returned demonstration;Verbalized understanding       OT Short Term Goals - 05/22/19 1043      OT SHORT TERM GOAL #1   Title  Patient will be educated and independent with HEP in order to faciliate her progress in therapy and allow her to utilize her LUE for daily tasks 75% or more of the time.    Time  4    Period  Weeks    Status  On-going    Target Date  06/17/19      OT SHORT TERM GOAL #2   Title  Patient will increase P/ROM of her LUE to [facility] in order to increase ability to complete upper body dressing tasks with less difficulty and compensatory movements.    Time  4    Period  Weeks    Status  On-going      OT SHORT TERM GOAL #3   Title  Patient will report a pain level of approximately 6/10 or less during functional use of her LUE.    Time  4    Period  Weeks    Status  On-going      OT SHORT TERM GOAL #4   Title  Patient  will increase her LUE strength to 3+/5 in order to utilize it for light household activities requiring shoulder level reaching.    Time  4    Period  Weeks    Status  On-going      OT SHORT TERM GOAL #5   Title  Patient will decrease her LUE fascial restrictions to mod amount or less in order to increase her functional mobility needed for reaching tasks.    Time  4    Period  Weeks    Status  On-going        OT Long Term Goals - 05/22/19 1041      OT LONG TERM GOAL #1   Title  Patient will return to use her LUE as her non-dominant extremity for all daily tasks without need for compensatory strategies due to pain level.    Time  8    Period  Weeks    Status  On-going      OT LONG TERM GOAL #2   Title  Patient will increase her LUE A/ROM to [facility] in order to complete reaching tasks above shoulder level.    Time  8    Period  Weeks    Status  On-going      OT LONG TERM GOAL #3   Title  Patient will increase her LUE strength to 4+/5 in order to return to carrying grocery bags using her LUE.    Time  8    Period  Weeks    Status  On-going      OT LONG TERM GOAL #4   Title  Patient will decrease her LUE pain to 3/10 or less while completing functional daily tasks.    Time  8    Period  Weeks    Status  On-going      OT LONG TERM GOAL #5   Title  Patient will decrease her LUE fascial restrictions to Min amount or less in order to increase the functional mobility needed for overhead reaching tasks.    Time  8  Period  Weeks    Status  On-going            Plan - 06/10/19 1110    Clinical Impression Statement  A: No fascial restrictions noted this date. Patient was able to progress to low weight strengthening using 1lb weights. Better form and technique demonstrated during scapular strengthening exercises. Only minimal verbal cueing required. Provided pt her own band and added to HEP.    Body Structure / Function / Physical Skills  ADL;UE functional use;Fascial  restriction;Pain;ROM;Strength    Plan  P: D/C passive stretching and myofascial release. Focus on strengthening    OT Home Exercise Plan  Eval: table slide, A/ROM scapula -extension, row. 4/7: A/ROM shoulder standing. 4/26: red theraband scapular strengthening exercises    Consulted and Agree with Plan of Care  Patient       Patient will benefit from skilled therapeutic intervention in order to improve the following deficits and impairments:   Body Structure / Function / Physical Skills: ADL, UE functional use, Fascial restriction, Pain, ROM, Strength       Visit Diagnosis: Acute pain of left shoulder  Stiffness of left shoulder, not elsewhere classified  Other symptoms and signs involving the musculoskeletal system    Problem List Patient Active Problem List   Diagnosis Date Noted  . Superficial fungus infection of skin 06/04/2019  . Screening for colorectal cancer 06/04/2019  . Encounter for gynecological examination with Papanicolaou smear of cervix 06/04/2019  . Positive fecal occult blood test 06/04/2019  . Hemorrhoids 06/04/2019  . Pain in left upper arm 05/15/2019  . Elevated glucose 04/30/2019  . Essential hypertension 04/18/2019  . Osteoporosis 04/18/2019  . Thickened endometrium 05/23/2017  . PMB (postmenopausal bleeding) 11/18/2014   Ailene Ravel, OTR/L,CBIS  [phone]  06/10/2019, 11:29 AM  [facility] [address] [place], [place], [address] Phone: [phone]   Fax:  [phone]  Name: Tonya Nelson MRN: [id number] Date of Birth: 1955-09-19

## 2019-06-10 NOTE — Patient Instructions (Signed)
1) (Home) Extension: Isometric / Bilateral Arm Retraction - Sitting   Facing anchor, hold hands and elbow at shoulder height, with elbow bent.  Pull arms back to squeeze shoulder blades together. Repeat 12-15 times. 1-3 times/day.   2) (Clinic) Extension / Flexion (Assist)   Face anchor, pull arms back, keeping elbow straight, and squeze shoulder blades together. Repeat 12-15 times. 1-3 times/day.   Copyright  VHI. All rights reserved.   3) (Home) Retraction: Row - Bilateral (Anchor)   Facing anchor, arms reaching forward, pull hands toward stomach, keeping elbows bent and at your sides and pinching shoulder blades together. Repeat 12-15 times. 1-3 times/day.   Copyright  VHI. All rights reserved.   

## 2019-06-12 ENCOUNTER — Other Ambulatory Visit: Payer: Self-pay

## 2019-06-12 ENCOUNTER — Encounter (HOSPITAL_COMMUNITY): Payer: Self-pay

## 2019-06-12 ENCOUNTER — Ambulatory Visit (HOSPITAL_COMMUNITY): Payer: 59

## 2019-06-12 DIAGNOSIS — M25512 Pain in left shoulder: Secondary | ICD-10-CM

## 2019-06-12 DIAGNOSIS — R29898 Other symptoms and signs involving the musculoskeletal system: Secondary | ICD-10-CM | POA: Diagnosis not present

## 2019-06-12 DIAGNOSIS — M25612 Stiffness of left shoulder, not elsewhere classified: Secondary | ICD-10-CM

## 2019-06-12 NOTE — Therapy (Signed)
[facility] [place], [place], [address] Phone: [phone]   Fax:  [phone]  Occupational Therapy Treatment  Patient Details  Name: Tonya Nelson MRN: [id number] Date of Birth: 1955/04/24 Referring Provider (OT): Cherly Beach, NP   Encounter Date: 06/12/2019  OT End of Session - 06/12/19 1101    Visit Number  6    Number of Visits  16    Date for OT Re-Evaluation  07/15/19    Authorization Type  [employer]    Authorization Time Period  Needs authorization. 30 visit limit    Authorization - Visit Number  6    Authorization - Number of Visits  30    OT Start Time  1030    OT Stop Time  1108    OT Time Calculation (min)  38 min    Activity Tolerance  Patient tolerated treatment well    Behavior During Therapy  WFL for tasks assessed/performed       Past Medical History:  Diagnosis Date  . Asthma   . Cellulitis    in toe on left foot  . Endometrial polyp 12/2014  . Essential hypertension   . PMB (postmenopausal bleeding) 11/18/2014  . Porokeratosis   . Thickened endometrium 05/23/2017   Will get endo bx    Past Surgical History:  Procedure Laterality Date  . WISDOM TOOTH EXTRACTION      There were no vitals filed for this visit.  Subjective Assessment - 06/12/19 1042    Subjective   S: it was a little sore last time. Not bad.    Currently in Pain?  Yes    Pain Score  2     Pain Location  Shoulder    Pain Orientation  Left    Pain Descriptors / Indicators  Sore    Pain Type  Acute pain    Pain Radiating Towards  N/A    Pain Onset  More than a month ago    Pain Frequency  Occasional    Aggravating Factors   trying to sleep on it, lifting items, certain movement    Pain Relieving Factors  pain medication if needed.    Effect of Pain on Daily Activities  min effect    Multiple Pain Sites  No         [facility] OT Assessment - 06/12/19 1101      Assessment   Medical Diagnosis  Left shoulder pain       Precautions   Precautions  None               OT Treatments/Exercises (OP) - 06/12/19 1043      Exercises   Exercises  Shoulder      Shoulder Exercises: Standing   Protraction  Strengthening;12 reps    Protraction Weight (lbs)  1    Horizontal ABduction  Strengthening;12 reps    Horizontal ABduction Weight (lbs)  1    External Rotation  Strengthening;12 reps    External Rotation Weight (lbs)  1    Internal Rotation  Strengthening;12 reps    Internal Rotation Weight (lbs)  1    Flexion  Strengthening;12 reps    Shoulder Flexion Weight (lbs)  1    ABduction  Strengthening;12 reps    Shoulder ABduction Weight (lbs)  1      Shoulder Exercises: Therapy Ball   Other Therapy Ball Exercises  green therapy ball; 12X, chest press, flexion, circles (left/right)  Shoulder Exercises: ROM/Strengthening   UBE (Upper Arm Bike)  Level 2 2' reverse 2' forward    pace: 6.0-7.0   Over Head Lace  2' seated with 1# wrist weight    X to V Arms  12X with 1#    Proximal Shoulder Strengthening, Seated  1# 12X with no rest breaks     Ball on Wall  1' flexion 1' abduction     Other ROM/Strengthening Exercises  proximal shoulder strengthening with 1# wrist weight; ABC's above shoulder level    Other ROM/Strengthening Exercises  Body Craft pulleys. extension, retraction, 20#. row 30# 12X                OT Short Term Goals - 05/22/19 1043      OT SHORT TERM GOAL #1   Title  Patient will be educated and independent with HEP in order to faciliate her progress in therapy and allow her to utilize her LUE for daily tasks 75% or more of the time.    Time  4    Period  Weeks    Status  On-going    Target Date  06/17/19      OT SHORT TERM GOAL #2   Title  Patient will increase P/ROM of her LUE to [facility] in order to increase ability to complete upper body dressing tasks with less difficulty and compensatory movements.    Time  4    Period  Weeks    Status  On-going      OT SHORT  TERM GOAL #3   Title  Patient will report a pain level of approximately 6/10 or less during functional use of her LUE.    Time  4    Period  Weeks    Status  On-going      OT SHORT TERM GOAL #4   Title  Patient will increase her LUE strength to 3+/5 in order to utilize it for light household activities requiring shoulder level reaching.    Time  4    Period  Weeks    Status  On-going      OT SHORT TERM GOAL #5   Title  Patient will decrease her LUE fascial restrictions to mod amount or less in order to increase her functional mobility needed for reaching tasks.    Time  4    Period  Weeks    Status  On-going        OT Long Term Goals - 05/22/19 1041      OT LONG TERM GOAL #1   Title  Patient will return to use her LUE as her non-dominant extremity for all daily tasks without need for compensatory strategies due to pain level.    Time  8    Period  Weeks    Status  On-going      OT LONG TERM GOAL #2   Title  Patient will increase her LUE A/ROM to [facility] in order to complete reaching tasks above shoulder level.    Time  8    Period  Weeks    Status  On-going      OT LONG TERM GOAL #3   Title  Patient will increase her LUE strength to 4+/5 in order to return to carrying grocery bags using her LUE.    Time  8    Period  Weeks    Status  On-going      OT LONG TERM GOAL #4   Title  Patient will  decrease her LUE pain to 3/10 or less while completing functional daily tasks.    Time  8    Period  Weeks    Status  On-going      OT LONG TERM GOAL #5   Title  Patient will decrease her LUE fascial restrictions to Min amount or less in order to increase the functional mobility needed for overhead reaching tasks.    Time  8    Period  Weeks    Status  On-going            Plan - 06/12/19 1101    Clinical Impression Statement  A: continued to focus on shoulder and scapular strengthening. Less need for rest breaks during session. VC for form and technique were provided.  patient is demonstrating increased strength while also demonstrating better form and technique.    Body Structure / Function / Physical Skills  ADL;UE functional use;Fascial restriction;Pain;ROM;Strength    Plan  P: Focus on strengthening and shoulder stability. Add red loop band wall exercises.    Consulted and Agree with Plan of Care  Patient       Patient will benefit from skilled therapeutic intervention in order to improve the following deficits and impairments:   Body Structure / Function / Physical Skills: ADL, UE functional use, Fascial restriction, Pain, ROM, Strength       Visit Diagnosis: Other symptoms and signs involving the musculoskeletal system  Stiffness of left shoulder, not elsewhere classified  Acute pain of left shoulder    Problem List Patient Active Problem List   Diagnosis Date Noted  . Superficial fungus infection of skin 06/04/2019  . Screening for colorectal cancer 06/04/2019  . Encounter for gynecological examination with Papanicolaou smear of cervix 06/04/2019  . Positive fecal occult blood test 06/04/2019  . Hemorrhoids 06/04/2019  . Pain in left upper arm 05/15/2019  . Elevated glucose 04/30/2019  . Essential hypertension 04/18/2019  . Osteoporosis 04/18/2019  . Thickened endometrium 05/23/2017  . PMB (postmenopausal bleeding) 11/18/2014   Ailene Ravel, OTR/L,CBIS  [phone]  06/12/2019, 1:32 PM  [facility] [address] [place], [place], [address] Phone: [phone]   Fax:  [phone]  Name: Tonya Nelson MRN: [id number] Date of Birth: May 28, 1955

## 2019-06-17 ENCOUNTER — Encounter (HOSPITAL_COMMUNITY): Payer: Self-pay

## 2019-06-17 ENCOUNTER — Ambulatory Visit (HOSPITAL_COMMUNITY): Payer: 59 | Attending: Family Medicine

## 2019-06-17 ENCOUNTER — Other Ambulatory Visit: Payer: Self-pay

## 2019-06-17 DIAGNOSIS — M25612 Stiffness of left shoulder, not elsewhere classified: Secondary | ICD-10-CM | POA: Insufficient documentation

## 2019-06-17 DIAGNOSIS — R29898 Other symptoms and signs involving the musculoskeletal system: Secondary | ICD-10-CM | POA: Insufficient documentation

## 2019-06-17 DIAGNOSIS — M25512 Pain in left shoulder: Secondary | ICD-10-CM | POA: Insufficient documentation

## 2019-06-17 NOTE — Therapy (Signed)
[place], [place], [address] Phone: [phone]   Fax:  [phone]  Occupational Therapy Treatment  Patient Details  Name: Tonya Nelson MRN: [id number] Date of Birth: 05/11/1955 Referring Provider (OT): Cherly Beach, NP   Encounter Date: 06/17/2019  OT End of Session - 06/17/19 1240    Visit Number  7    Number of Visits  16    Date for OT Re-Evaluation  07/15/19    Authorization Type  [employer]    Authorization Time Period  Needs authorization. 30 visit limit    Authorization - Visit Number  7    Authorization - Number of Visits  30    OT Start Time  [id number]    OT Stop Time  1108    OT Time Calculation (min)  32 min    Activity Tolerance  Patient tolerated treatment well    Behavior During Therapy  WFL for tasks assessed/performed       Past Medical History:  Diagnosis Date  . Asthma   . Cellulitis    in toe on left foot  . Endometrial polyp 12/2014  . Essential hypertension   . PMB (postmenopausal bleeding) 11/18/2014  . Porokeratosis   . Thickened endometrium 05/23/2017   Will get endo bx    Past Surgical History:  Procedure Laterality Date  . WISDOM TOOTH EXTRACTION      There were no vitals filed for this visit.  Subjective Assessment - 06/17/19 1038    Subjective   S: I can tell my range of motion and pain have improved since coming here.    Currently in Pain?  Yes    Pain Score  2     Pain Location  Shoulder    Pain Orientation  Left    Pain Descriptors / Indicators  Sore         [facility] OT Assessment - 06/17/19 1038      Assessment   Medical Diagnosis  Left shoulder pain      Precautions   Precautions  None               OT Treatments/Exercises (OP) - 06/17/19 1038      Exercises   Exercises  Shoulder      Shoulder Exercises: Standing   Protraction  Strengthening;15 reps    Protraction Weight (lbs)  1    Horizontal ABduction  Strengthening;12 reps    Horizontal ABduction Weight (lbs)  1    External Rotation  Strengthening;12 reps    External Rotation Weight (lbs)  1    Internal Rotation  Strengthening;12 reps    Internal Rotation Weight (lbs)  1    Flexion  Strengthening;12 reps    Shoulder Flexion Weight (lbs)  1    ABduction  Strengthening;12 reps    Shoulder ABduction Weight (lbs)  1    Other Standing Exercises  red band loop; wall clocks, wall slide lift off, lateral wall taps. 10X      Shoulder Exercises: ROM/Strengthening   UBE (Upper Arm Bike)  Level 2 2' reverse 2' forward    pace: 6.0-7.0   Over Head Lace  2' seated with 1# wrist weight    X to V Arms  12X with 1#    Proximal Shoulder Strengthening, Seated  1# 12X with no rest breaks     Ball on Wall  1' flexion 1' abduction     Other ROM/Strengthening Exercises  proximal shoulder  strengthening with 1# wrist weight; ABC's above shoulder level               OT Short Term Goals - 05/22/19 1043      OT SHORT TERM GOAL #1   Title  Patient will be educated and independent with HEP in order to faciliate her progress in therapy and allow her to utilize her LUE for daily tasks 75% or more of the time.    Time  4    Period  Weeks    Status  On-going    Target Date  06/17/19      OT SHORT TERM GOAL #2   Title  Patient will increase P/ROM of her LUE to [facility] in order to increase ability to complete upper body dressing tasks with less difficulty and compensatory movements.    Time  4    Period  Weeks    Status  On-going      OT SHORT TERM GOAL #3   Title  Patient will report a pain level of approximately 6/10 or less during functional use of her LUE.    Time  4    Period  Weeks    Status  On-going      OT SHORT TERM GOAL #4   Title  Patient will increase her LUE strength to 3+/5 in order to utilize it for light household activities requiring shoulder level reaching.    Time  4    Period  Weeks    Status  On-going      OT SHORT TERM GOAL #5   Title  Patient  will decrease her LUE fascial restrictions to mod amount or less in order to increase her functional mobility needed for reaching tasks.    Time  4    Period  Weeks    Status  On-going        OT Long Term Goals - 05/22/19 1041      OT LONG TERM GOAL #1   Title  Patient will return to use her LUE as her non-dominant extremity for all daily tasks without need for compensatory strategies due to pain level.    Time  8    Period  Weeks    Status  On-going      OT LONG TERM GOAL #2   Title  Patient will increase her LUE A/ROM to [facility] in order to complete reaching tasks above shoulder level.    Time  8    Period  Weeks    Status  On-going      OT LONG TERM GOAL #3   Title  Patient will increase her LUE strength to 4+/5 in order to return to carrying grocery bags using her LUE.    Time  8    Period  Weeks    Status  On-going      OT LONG TERM GOAL #4   Title  Patient will decrease her LUE pain to 3/10 or less while completing functional daily tasks.    Time  8    Period  Weeks    Status  On-going      OT LONG TERM GOAL #5   Title  Patient will decrease her LUE fascial restrictions to Min amount or less in order to increase the functional mobility needed for overhead reaching tasks.    Time  8    Period  Weeks    Status  On-going  Plan - 06/17/19 1248    Clinical Impression Statement  A: Continued with stregthening and shoulder stability while adding red band exercises at wall. VC for form and technique. Patient was able to perform all exercises with minimal difficulty. Improved form and technique noted this date.    Body Structure / Function / Physical Skills  ADL;UE functional use;Fascial restriction;Pain;ROM;Strength    Plan  P: mini reassess with possible discharge.    Consulted and Agree with Plan of Care  Patient       Patient will benefit from skilled therapeutic intervention in order to improve the following deficits and impairments:   Body Structure /  Function / Physical Skills: ADL, UE functional use, Fascial restriction, Pain, ROM, Strength       Visit Diagnosis: Other symptoms and signs involving the musculoskeletal system  Stiffness of left shoulder, not elsewhere classified  Acute pain of left shoulder    Problem List Patient Active Problem List   Diagnosis Date Noted  . Superficial fungus infection of skin 06/04/2019  . Screening for colorectal cancer 06/04/2019  . Encounter for gynecological examination with Papanicolaou smear of cervix 06/04/2019  . Positive fecal occult blood test 06/04/2019  . Hemorrhoids 06/04/2019  . Pain in left upper arm 05/15/2019  . Elevated glucose 04/30/2019  . Essential hypertension 04/18/2019  . Osteoporosis 04/18/2019  . Thickened endometrium 05/23/2017  . PMB (postmenopausal bleeding) 11/18/2014   Ailene Ravel, OTR/L,CBIS  [phone]  06/17/2019, 12:50 PM  [facility] [address] [place], [place], [address] Phone: [phone]   Fax:  [phone]  Name: Tonya Nelson MRN: [id number] Date of Birth: [birth year]

## 2019-06-19 ENCOUNTER — Ambulatory Visit (HOSPITAL_COMMUNITY): Payer: 59

## 2019-06-19 ENCOUNTER — Other Ambulatory Visit: Payer: Self-pay

## 2019-06-19 DIAGNOSIS — R29898 Other symptoms and signs involving the musculoskeletal system: Secondary | ICD-10-CM | POA: Diagnosis not present

## 2019-06-19 DIAGNOSIS — M25512 Pain in left shoulder: Secondary | ICD-10-CM

## 2019-06-19 DIAGNOSIS — M25612 Stiffness of left shoulder, not elsewhere classified: Secondary | ICD-10-CM

## 2019-06-19 NOTE — Patient Instructions (Signed)
Complete once a day or every other day. 10-15 repetitions.   1) Strengthening: Chest Pull - Resisted   Hold Theraband in front of body with hands about shoulder width a part. Pull band a part and back together slowly. Repeat ____ times. Complete ____ set(s) per session.. Repeat ____ session(s) per day.  http://orth.exer.us/926   Copyright  [employer]HI. All rights reserved.   2) PNF Strengthening: Resisted   Standing with resistive band around each hand, bring right arm up and away, thumb back. Repeat ____ times per set. Do ____ sets per session. Do ____ sessions per day.      3) Resisted External Rotation: in Neutral - Bilateral   Sit or stand, tubing in both hands, elbows at sides, bent to 90, forearms forward. Pinch shoulder blades together and rotate forearms out. Keep elbows at sides. Repeat ____ times per set. Do ____ sets per session. Do ____ sessions per day.  http://orth.exer.us/966   Copyright  VHI. All rights reserved.   4) PNF Strengthening: Resisted   Standing, hold resistive band above head. Bring right arm down and out from side. Repeat ____ times per set. Do ____ sets per session. Do ____ sessions per day.  http://orth.exer.us/922   Copyright  VHI. All rights reserved.

## 2019-06-19 NOTE — Therapy (Signed)
[place], [place], [address] Phone: [phone]   Fax:  [phone]  Occupational Therapy Treatment Reassessment/discharge summary Patient Details  Name: Tonya Nelson MRN: [id number] Date of Birth: 1955/09/19 Referring Provider (OT): Cherly Beach, NP   Encounter Date: 06/19/2019  OT End of Session - 06/19/19 1339    Visit Number  8    Number of Visits  16    Date for OT Re-Evaluation  07/15/19    Authorization Type  [employer]    Authorization Time Period  Needs authorization. 30 visit limit    Authorization - Visit Number  8    Authorization - Number of Visits  30    OT Start Time  1030   reassess and discharge   OT Stop Time  1100    OT Time Calculation (min)  30 min    Activity Tolerance  Patient tolerated treatment well    Behavior During Therapy  WFL for tasks assessed/performed       Past Medical History:  Diagnosis Date  . Asthma   . Cellulitis    in toe on left foot  . Endometrial polyp 12/2014  . Essential hypertension   . PMB (postmenopausal bleeding) 11/18/2014  . Porokeratosis   . Thickened endometrium 05/23/2017   Will get endo bx    Past Surgical History:  Procedure Laterality Date  . WISDOM TOOTH EXTRACTION      There were no vitals filed for this visit.  Subjective Assessment - 06/19/19 1053    Subjective   S: It's going great. I can really tell a difference.    Currently in Pain?  Yes    Pain Score  1     Pain Location  Shoulder    Pain Orientation  Left    Pain Descriptors / Indicators  Sore    Pain Type  Acute pain    Pain Radiating Towards  N/A    Pain Onset  More than a month ago    Pain Frequency  Occasional    Aggravating Factors   trying to sleep on it. lifting items, certain movements    Pain Relieving Factors  pain medication if needed.    Effect of Pain on Daily Activities  no effect    Multiple Pain Sites  No         [facility] OT Assessment - 06/19/19 1036       Assessment   Medical Diagnosis  Left shoulder pain      Precautions   Precautions  None      Observation/Other Assessments   Focus on Therapeutic Outcomes (FOTO)   98/100      ROM / Strength   AROM / PROM / Strength  AROM;Strength;PROM      Palpation   Palpation comment  trace fascial restrictions in left upper arm, trapezius, and scapularis region.      AROM   Overall AROM Comments  Assessed seated. IR/er adducted    AROM Assessment Site  Shoulder    Right/Left Shoulder  Left    Left Shoulder Flexion  140 Degrees   previous: 70   Left Shoulder ABduction  170 Degrees   previous; 76   Left Shoulder Internal Rotation  90 Degrees   previous: same   Left Shoulder External Rotation  70 Degrees   previous: 52     PROM   Overall PROM   Within functional limits for tasks performed    Overall  PROM Comments  P/ROM is WNL in all ranges.     PROM Assessment Site  Shoulder    Right/Left Shoulder  Left      Strength   Overall Strength Comments  Assessed seated. IR/er adducted    Strength Assessment Site  Shoulder    Right/Left Shoulder  Left    Left Shoulder Flexion  4+/5   previous: 3-/5   Left Shoulder ABduction  5/5   previous: 3-/5   Left Shoulder Internal Rotation  5/5   previous: 3/5   Left Shoulder External Rotation  4+/5   previous: 3-/5              OT Treatments/Exercises (OP) - 06/19/19 1055      Exercises   Exercises  Shoulder      Shoulder Exercises: Standing   Horizontal ABduction  Theraband;10 reps    Theraband Level (Shoulder Horizontal ABduction)  Level 2 (Red)    External Rotation  Theraband;10 reps    Theraband Level (Shoulder External Rotation)  Level 2 (Red)    Internal Rotation  Theraband;10 reps    Theraband Level (Shoulder Internal Rotation)  Level 2 (Red)    ABduction  Theraband;10 reps    Theraband Level (Shoulder ABduction)  Level 2 (Red)    Other Standing Exercises  red band shoulder adduction red band 10X              OT Education - 06/19/19 1053    Education Details  shoulder strengthening with red band    Person(s) Educated  Patient    Methods  Explanation;Demonstration;Verbal cues;Handout    Comprehension  Verbal cues required;Returned demonstration;Verbalized understanding       OT Short Term Goals - 06/19/19 1043      OT SHORT TERM GOAL #1   Title  Patient will be educated and independent with HEP in order to faciliate her progress in therapy and allow her to utilize her LUE for daily tasks 75% or more of the time.    Time  4    Period  Weeks    Status  Achieved    Target Date  06/17/19      OT SHORT TERM GOAL #2   Title  Patient will increase P/ROM of her LUE to [facility] in order to increase ability to complete upper body dressing tasks with less difficulty and compensatory movements.    Time  4    Period  Weeks    Status  Achieved      OT SHORT TERM GOAL #3   Title  Patient will report a pain level of approximately 6/10 or less during functional use of her LUE.    Time  4    Period  Weeks    Status  Achieved      OT SHORT TERM GOAL #4   Title  Patient will increase her LUE strength to 3+/5 in order to utilize it for light household activities requiring shoulder level reaching.    Time  4    Period  Weeks    Status  Achieved      OT SHORT TERM GOAL #5   Title  Patient will decrease her LUE fascial restrictions to mod amount or less in order to increase her functional mobility needed for reaching tasks.    Time  4    Period  Weeks    Status  Achieved        OT Long Term Goals - 06/19/19 1042  OT LONG TERM GOAL #1   Title  Patient will return to use her LUE as her non-dominant extremity for all daily tasks without need for compensatory strategies due to pain level.    Time  8    Period  Weeks    Status  Achieved      OT LONG TERM GOAL #2   Title  Patient will increase her LUE A/ROM to [facility] in order to complete reaching tasks above shoulder level.    Time  8     Period  Weeks    Status  Achieved      OT LONG TERM GOAL #3   Title  Patient will increase her LUE strength to 4+/5 in order to return to carrying grocery bags using her LUE.    Time  8    Period  Weeks    Status  Achieved      OT LONG TERM GOAL #4   Title  Patient will decrease her LUE pain to 3/10 or less while completing functional daily tasks.    Time  8    Period  Weeks    Status  Achieved      OT LONG TERM GOAL #5   Title  Patient will decrease her LUE fascial restrictions to Min amount or less in order to increase the functional mobility needed for overhead reaching tasks.    Time  8    Period  Weeks    Status  Achieved            Plan - 06/19/19 1459    Clinical Impression Statement  A: Reassessment completed this date. Patient has met all short and long term goals. Patient presents with full ROM and 4+ to 5/5 muscle strength. Pt is able to use her LUE for all desired daily tasks without issues and pain. HEP was updated and reviewed. Patient is in agreement with discharge recommendation.    Body Structure / Function / Physical Skills  ADL;UE functional use;Fascial restriction;Pain;ROM;Strength    Plan  P: D/C from OT services with HEP.    Consulted and Agree with Plan of Care  Patient       Patient will benefit from skilled therapeutic intervention in order to improve the following deficits and impairments:   Body Structure / Function / Physical Skills: ADL, UE functional use, Fascial restriction, Pain, ROM, Strength       Visit Diagnosis: Stiffness of left shoulder, not elsewhere classified  Acute pain of left shoulder  Other symptoms and signs involving the musculoskeletal system    Problem List Patient Active Problem List   Diagnosis Date Noted  . Superficial fungus infection of skin 06/04/2019  . Screening for colorectal cancer 06/04/2019  . Encounter for gynecological examination with Papanicolaou smear of cervix 06/04/2019  . Positive fecal  occult blood test 06/04/2019  . Hemorrhoids 06/04/2019  . Pain in left upper arm 05/15/2019  . Elevated glucose 04/30/2019  . Essential hypertension 04/18/2019  . Osteoporosis 04/18/2019  . Thickened endometrium 05/23/2017  . PMB (postmenopausal bleeding) 11/18/2014   OCCUPATIONAL THERAPY DISCHARGE SUMMARY  Visits from Start of Care: 8  Current functional level related to goals / functional outcomes: See above   Remaining deficits: See above   Education / Equipment: See above Plan: Patient agrees to discharge.  Patient goals were met. Patient is being discharged due to meeting the stated rehab goals.  ?????        Ailene Ravel, OTR/L,CBIS  [id number]  06/19/2019, 3:03 PM  [place], [place], [address] Phone: [phone]   Fax:  [phone]  Name: Tonya Nelson MRN: [id number] Date of Birth: July 21, 1955

## 2019-06-24 ENCOUNTER — Ambulatory Visit (HOSPITAL_COMMUNITY): Payer: 59

## 2019-06-26 ENCOUNTER — Encounter (HOSPITAL_COMMUNITY): Payer: 59

## 2019-07-01 ENCOUNTER — Encounter (HOSPITAL_COMMUNITY): Payer: 59 | Admitting: Occupational Therapy

## 2019-07-03 ENCOUNTER — Encounter (HOSPITAL_COMMUNITY): Payer: 59 | Admitting: Specialist

## 2019-07-08 ENCOUNTER — Encounter (HOSPITAL_COMMUNITY): Payer: 59 | Admitting: Occupational Therapy

## 2019-07-10 ENCOUNTER — Encounter (HOSPITAL_COMMUNITY): Payer: 59

## 2019-07-11 ENCOUNTER — Ambulatory Visit (INDEPENDENT_AMBULATORY_CARE_PROVIDER_SITE_OTHER): Payer: 59 | Admitting: Family Medicine

## 2019-07-11 ENCOUNTER — Other Ambulatory Visit: Payer: Self-pay

## 2019-07-11 ENCOUNTER — Encounter: Payer: Self-pay | Admitting: Family Medicine

## 2019-07-11 VITALS — BP 140/84 | HR 54 | Temp 97.1°F | Ht 67.0 in | Wt 232.0 lb

## 2019-07-11 DIAGNOSIS — Z1211 Encounter for screening for malignant neoplasm of colon: Secondary | ICD-10-CM

## 2019-07-11 DIAGNOSIS — M810 Age-related osteoporosis without current pathological fracture: Secondary | ICD-10-CM

## 2019-07-11 DIAGNOSIS — Z1212 Encounter for screening for malignant neoplasm of rectum: Secondary | ICD-10-CM

## 2019-07-11 DIAGNOSIS — I10 Essential (primary) hypertension: Secondary | ICD-10-CM | POA: Diagnosis not present

## 2019-07-11 NOTE — Patient Instructions (Addendum)
I appreciate the opportunity to provide you with care for your health and wellness. Today we discussed: shoulder follow up and health maintenance    Follow up: 6 months for annual appt-morning fasting appt A1c in office  No labs or referrals today   Orders: Mammogram ordered earlier, can we get mobile appt today   Complete cologuard and we will see if colonoscopy is needed  Labs overall are great, work on walking more. Work on have a low fat diet, eat 60-75% of meals to be veggies and fruit. Have a great summer :)  Please continue to practice social distancing to keep you, your family, and our community safe.  If you must go out, please wear a mask and practice good handwashing.  It was a pleasure to see you and I look forward to continuing to work together on your health and well-being. Please do not hesitate to call the office if you need care or have questions about your care.  Have a wonderful day and week. With Gratitude, Cherly Beach, DNP, AGNP-BC

## 2019-07-11 NOTE — Progress Notes (Deleted)
     Subjective:  Patient ID: Tonya Nelson, female    DOB: Nov 29, 1955  Age: 64 y.o. MRN: [id number]  CC:  Chief Complaint  Patient presents with  . Follow-up    left shoulder pain has finished physical therapy is feeling much better      HPI  HPI     Today patient denies signs and symptoms of COVID 19 infection including fever, chills, cough, shortness of breath, and headache. Past Medical, Surgical, Social History, Allergies, and Medications have been Reviewed.   Past Medical History:  Diagnosis Date  . Asthma   . Cellulitis    in toe on left foot  . Endometrial polyp 12/2014  . Essential hypertension   . PMB (postmenopausal bleeding) 11/18/2014  . Porokeratosis   . Thickened endometrium 05/23/2017   Will get endo bx    Current Meds  Medication Sig  . bisoprolol-hydrochlorothiazide (ZIAC) 10-6.25 MG tablet Take 1 tablet by mouth daily.  . cholecalciferol (VITAMIN D) 1000 units tablet Take 2,000 Units by mouth daily.  . diclofenac (VOLTAREN) 25 MG EC tablet Take 1 tablet (25 mg total) by mouth 2 (two) times daily.  . fluconazole (DIFLUCAN) 150 MG tablet Take 1 now and repeat 1 in 3 days  . furosemide (LASIX) 40 MG tablet Take 1 tablet (40 mg total) by mouth daily.  Marland Kitchen nystatin (MYCOSTATIN/NYSTOP) powder Apply 1 application topically 3 (three) times daily.    ROS:  ROS   Objective:   Today's Vitals: BP (!) 142/84 (BP Location: Right Arm, Patient Position: Sitting, Cuff Size: Normal)   Pulse (!) 54   Temp (!) 97.1 F (36.2 C) (Temporal)   Ht 5\' 7"  (1.702 m)   Wt 232 lb (105.2 kg)   SpO2 96%   BMI 36.34 kg/m  Vitals with BMI 07/11/2019 06/04/2019 05/15/2019  Height 5\' 7"  5' 5.25" 5\' 7"   Weight 232 lbs 232 lbs 232 lbs  BMI 36.33 0000000 Q000111Q  Systolic [id number] Q000111Q -  Diastolic 84 79 -  Pulse 54 67 59     Physical Exam       Assessment   No diagnosis found.  Tests ordered No orders of the defined types were placed in this  encounter.    Plan: Please see assessment and plan per problem list above.   No orders of the defined types were placed in this encounter.   Patient to follow-up in ***.  Perlie Mayo, NP

## 2019-07-11 NOTE — Assessment & Plan Note (Signed)
Previous documentation of osteoporosis however she reports she has never had a bone scan until this year.  Per her DEXA scan she is normal with bone mineralization.  And not noted to have osteopenia or osteoporosis.  However I have advised for her to continue the vitamin D as it is helping her with rest sleep and overall energy levels.

## 2019-07-11 NOTE — Progress Notes (Signed)
Subjective:  Patient ID: Tonya Nelson, female    DOB: 1955/04/10  Age: 64 y.o. MRN: [id number]  CC:  Chief Complaint  Patient presents with  . Follow-up    left shoulder pain has finished physical therapy is feeling much better      HPI  HPI Ms Tonya Nelson is a 64 year old female patient of mine.  She presents today for follow-up on left shoulder pain.  She was prescribed physical therapy at the end of March for stiffness and left shoulder pain.  She reports that she is doing much better and is not having any issues with her pain or shoulder mobility at this time.  She reports that she is got her Cologuard at home and she will be doing it here soon.  And has not gotten her mammogram set up so that will be set up today before she leaves the office.  She denies having any changes in any of her health outside of her arm feeling better.  She demonstrates full range of motion in the office when questioned about this.  She denies having any chest pain, headaches, dizziness, vision changes, shortness of breath, cough, leg swelling or any other signs or symptoms of blood pressure and or cardiac/respiratory distress.   Today patient denies signs and symptoms of COVID 19 infection including fever, chills, cough, shortness of breath, and headache. Past Medical, Surgical, Social History, Allergies, and Medications have been Reviewed.   Past Medical History:  Diagnosis Date  . Asthma   . Cellulitis    in toe on left foot  . Endometrial polyp 12/2014  . Essential hypertension   . Pain in left upper arm 05/15/2019  . PMB (postmenopausal bleeding) 11/18/2014  . Porokeratosis   . Superficial fungus infection of skin 06/04/2019  . Thickened endometrium 05/23/2017   Will get endo bx    Current Meds  Medication Sig  . bisoprolol-hydrochlorothiazide (ZIAC) 10-6.25 MG tablet Take 1 tablet by mouth daily.  . cholecalciferol (VITAMIN D) 1000 units tablet Take 2,000 Units by mouth daily.  .  diclofenac (VOLTAREN) 25 MG EC tablet Take 1 tablet (25 mg total) by mouth 2 (two) times daily.  . fluconazole (DIFLUCAN) 150 MG tablet Take 1 now and repeat 1 in 3 days  . furosemide (LASIX) 40 MG tablet Take 1 tablet (40 mg total) by mouth daily.  Marland Kitchen nystatin (MYCOSTATIN/NYSTOP) powder Apply 1 application topically 3 (three) times daily.    ROS:  Review of Systems  Constitutional: Negative.   HENT: Negative.   Eyes: Negative.   Respiratory: Negative.   Cardiovascular: Negative.   Gastrointestinal: Negative.   Genitourinary: Negative.   Musculoskeletal: Negative.   Skin: Negative.   Neurological: Negative.   Endo/Heme/Allergies: Negative.   Psychiatric/Behavioral: Negative.   All other systems reviewed and are negative.    Objective:   Today's Vitals: BP 140/84 (BP Location: Right Arm, Patient Position: Sitting, Cuff Size: Normal)   Pulse (!) 54   Temp (!) 97.1 F (36.2 C) (Temporal)   Ht 5\' 7"  (1.702 m)   Wt 232 lb (105.2 kg)   SpO2 96%   BMI 36.34 kg/m  Vitals with BMI 07/11/2019 06/04/2019 05/15/2019  Height 5\' 7"  5' 5.25" 5\' 7"   Weight 232 lbs 232 lbs 232 lbs  BMI 36.33 0000000 Q000111Q  Systolic [id number] Q000111Q -  Diastolic 84 79 -  Pulse 54 67 59     Physical Exam Vitals and nursing note reviewed.  Constitutional:  Appearance: Normal appearance. She is well-developed and well-groomed. She is obese.  HENT:     Head: Normocephalic and atraumatic.     Right Ear: External ear normal.     Left Ear: External ear normal.     Mouth/Throat:     Comments: Mask in place Eyes:     General:        Right eye: No discharge.        Left eye: No discharge.     Conjunctiva/sclera: Conjunctivae normal.  Cardiovascular:     Rate and Rhythm: Normal rate and regular rhythm.     Pulses: Normal pulses.     Heart sounds: Normal heart sounds.  Pulmonary:     Effort: Pulmonary effort is normal.     Breath sounds: Normal breath sounds.  Musculoskeletal:        General: Normal range  of motion.     Cervical back: Normal range of motion and neck supple.     Comments: Complete full range of motion bilaterally with shoulders.  Mobility without pain.  Skin:    General: Skin is warm.  Neurological:     General: No focal deficit present.     Mental Status: She is alert and oriented to person, place, and time.  Psychiatric:        Attention and Perception: Attention normal.        Mood and Affect: Mood normal.        Speech: Speech normal.        Behavior: Behavior normal. Behavior is cooperative.        Thought Content: Thought content normal.        Cognition and Memory: Cognition normal.        Judgment: Judgment normal.      Assessment   1. Essential hypertension   2. Osteoporosis without current pathological fracture, unspecified osteoporosis type   3. Screening for colorectal cancer     Tests ordered No orders of the defined types were placed in this encounter.    Plan: Please see assessment and plan per problem list above.   No orders of the defined types were placed in this encounter.   Patient to follow-up in 6 months for annual in office we will get A1c in office she will be fasting and will get labs that day.   Perlie Mayo, NP

## 2019-07-11 NOTE — Assessment & Plan Note (Signed)
Tonya Nelson is encouraged to maintain a well balanced diet that is low in salt. Controlled, however would like to see her top number in the 130s more.  We will continue current medication at this time and adjust at next appointment if needed.  She reports that she is going to be working on increasing activity and watching her diet.  Continue current medication regimen.  Additionally, she is also reminded that exercise is beneficial for heart health and control of  Blood pressure. 30-60 minutes daily is recommended-walking was suggested.

## 2019-07-11 NOTE — Assessment & Plan Note (Signed)
Colorectal screening was positive for Hemoccult in GYN office last month.  However we had already talked about doing a Cologuard.  She reports she wants to do the Cologuard versus continue with the 3 Hemoccult samples.

## 2019-07-16 ENCOUNTER — Encounter (HOSPITAL_COMMUNITY): Payer: 59 | Admitting: Occupational Therapy

## 2019-07-18 ENCOUNTER — Encounter (HOSPITAL_COMMUNITY): Payer: 59 | Admitting: Occupational Therapy

## 2019-08-23 ENCOUNTER — Ambulatory Visit
Admission: RE | Admit: 2019-08-23 | Discharge: 2019-08-23 | Disposition: A | Discharge status: Home / Self Care | Payer: 59 | Source: Ambulatory Visit | Attending: Family Medicine | Admitting: Family Medicine

## 2019-08-23 ENCOUNTER — Other Ambulatory Visit: Payer: Self-pay

## 2019-08-23 DIAGNOSIS — Z1231 Encounter for screening mammogram for malignant neoplasm of breast: Secondary | ICD-10-CM

## 2019-10-03 ENCOUNTER — Ambulatory Visit: Payer: 59

## 2019-10-08 ENCOUNTER — Other Ambulatory Visit: Payer: Self-pay | Admitting: Family Medicine

## 2019-12-12 ENCOUNTER — Other Ambulatory Visit: Payer: Self-pay

## 2019-12-12 ENCOUNTER — Ambulatory Visit (INDEPENDENT_AMBULATORY_CARE_PROVIDER_SITE_OTHER): Payer: Medicare Other | Admitting: Family Medicine

## 2019-12-12 ENCOUNTER — Encounter: Payer: Self-pay | Admitting: Family Medicine

## 2019-12-12 VITALS — BP 131/75 | HR 92 | Temp 98.1°F | Ht 66.0 in | Wt 239.0 lb

## 2019-12-12 DIAGNOSIS — G4709 Other insomnia: Secondary | ICD-10-CM | POA: Diagnosis not present

## 2019-12-12 NOTE — Patient Instructions (Signed)
  HAPPY FALL!  I appreciate the opportunity to provide you with care for your health and wellness. Today we discussed: insurance forms  Follow up: 01/14/2020 as scheduled  No labs or referrals today  I hope you can get some good sleep with the melatonin  Please continue to practice social distancing to keep you, your family, and our community safe.  If you must go out, please wear a mask and practice good handwashing.  It was a pleasure to see you and I look forward to continuing to work together on your health and well-being. Please do not hesitate to call the office if you need care or have questions about your care.  Have a wonderful day and week. With Gratitude, Cherly Beach, DNP, AGNP-BC

## 2019-12-12 NOTE — Assessment & Plan Note (Signed)
Melatonin is suggested to try Encouraged her to talk to her son at a different time as well to help her get the sleep she needs.  Reviewed side effects, risks and benefits of medication.   Patient acknowledged agreement and understanding of the plan.

## 2019-12-12 NOTE — Progress Notes (Signed)
Subjective:  Patient ID: Tonya Nelson, female    DOB: 02-25-1955  Age: 64 y.o. MRN: [id number]  CC:  Chief Complaint  Patient presents with  . Insomnia    x1-2 weeks      HPI  Tonya Nelson is a 64 year old female patient who presents for an acute visit. Insomnia Primary symptoms: fragmented sleep, sleep disturbance.  The current episode started one month. The onset quality is sudden. The problem occurs nightly. The problem is unchanged. Exacerbated by: son calls her on his lunch break at work. How many beverages per day that contain caffeine: 0 - 1.  Past treatments include nothing. Prior diagnostic workup includes:  No prior workup.  She wants to hear from her son, but has trouble falling back asleep after he calls. She reports he works third shift now after being laid off for 2 months. This is there time to talk, even though she is sleeping. Denies any other issues or concerns. Does not want prescription medications for this.  Today patient denies signs and symptoms of COVID 19 infection including fever, chills, cough, shortness of breath, and headache. Past Medical, Surgical, Social History, Allergies, and Medications have been Reviewed.   Past Medical History:  Diagnosis Date  . Asthma   . Cellulitis    in toe on left foot  . Endometrial polyp 12/2014  . Essential hypertension   . Pain in left upper arm 05/15/2019  . PMB (postmenopausal bleeding) 11/18/2014  . Porokeratosis   . Superficial fungus infection of skin 06/04/2019  . Thickened endometrium 05/23/2017   Will get endo bx    Current Meds  Medication Sig  . bisoprolol-hydrochlorothiazide (ZIAC) 10-6.25 MG tablet Take 1 tablet by mouth daily.  . cholecalciferol (VITAMIN D) 1000 units tablet Take 2,000 Units by mouth daily.  . diclofenac (VOLTAREN) 25 MG EC tablet Take 1 tablet (25 mg total) by mouth 2 (two) times daily.  . furosemide (LASIX) 40 MG tablet Take 1 tablet (40 mg total) by mouth daily.    ROS:   Review of Systems  Psychiatric/Behavioral: Positive for sleep disturbance. The patient has insomnia.      Objective:   Today's Vitals: BP 131/75 (BP Location: Right Arm, Patient Position: Sitting, Cuff Size: Large)   Pulse 92   Temp 98.1 F (36.7 C) (Tympanic)   Ht 5\' 6"  (1.676 m)   Wt 239 lb (108.4 kg)   SpO2 98%   BMI 38.58 kg/m  Vitals with BMI 12/12/2019 07/11/2019 06/04/2019  Height 5\' 6"  5\' 7"  5' 5.25"  Weight 239 lbs 232 lbs 232 lbs  BMI 38.59 17.61 60.73  Systolic 710 626 948  Diastolic 75 84 79  Pulse 92 54 67     Physical Exam Vitals and nursing note reviewed.  Constitutional:      Appearance: Normal appearance. She is well-developed and well-groomed. She is obese.  HENT:     Head: Normocephalic and atraumatic.     Right Ear: External ear normal.     Left Ear: External ear normal.  Eyes:     General:        Right eye: No discharge.        Left eye: No discharge.     Conjunctiva/sclera: Conjunctivae normal.  Cardiovascular:     Rate and Rhythm: Normal rate and regular rhythm.     Pulses: Normal pulses.     Heart sounds: Normal heart sounds.  Pulmonary:     Effort: Pulmonary  effort is normal.     Breath sounds: Normal breath sounds.  Musculoskeletal:        General: Normal range of motion.     Cervical back: Normal range of motion and neck supple.  Skin:    General: Skin is warm.  Neurological:     General: No focal deficit present.     Mental Status: She is alert and oriented to person, place, and time.  Psychiatric:        Attention and Perception: Attention normal.        Mood and Affect: Mood normal.        Speech: Speech normal.        Behavior: Behavior normal. Behavior is cooperative.        Thought Content: Thought content normal.        Cognition and Memory: Cognition normal.        Judgment: Judgment normal.      Assessment   1. Other insomnia     Tests ordered No orders of the defined types were placed in this  encounter.    Plan: Please see assessment and plan per problem list above.   No orders of the defined types were placed in this encounter.   Patient to follow-up in 01/14/2020  Note: This dictation was prepared with Dragon dictation along with smaller phrase technology. Similar sounding words can be transcribed inadequately or may not be corrected upon review. Any transcriptional errors that result from this process are unintentional.      Perlie Mayo, NP

## 2019-12-16 DIAGNOSIS — M79674 Pain in right toe(s): Secondary | ICD-10-CM | POA: Diagnosis not present

## 2019-12-16 DIAGNOSIS — M79672 Pain in left foot: Secondary | ICD-10-CM | POA: Diagnosis not present

## 2019-12-16 DIAGNOSIS — L110 Acquired keratosis follicularis: Secondary | ICD-10-CM | POA: Diagnosis not present

## 2019-12-16 DIAGNOSIS — M79671 Pain in right foot: Secondary | ICD-10-CM | POA: Diagnosis not present

## 2019-12-16 DIAGNOSIS — I739 Peripheral vascular disease, unspecified: Secondary | ICD-10-CM | POA: Diagnosis not present

## 2020-01-14 ENCOUNTER — Other Ambulatory Visit: Payer: Self-pay

## 2020-01-14 ENCOUNTER — Encounter: Payer: Self-pay | Admitting: Family Medicine

## 2020-01-14 ENCOUNTER — Ambulatory Visit (INDEPENDENT_AMBULATORY_CARE_PROVIDER_SITE_OTHER): Payer: 59 | Admitting: Family Medicine

## 2020-01-14 VITALS — BP 130/72 | HR 85 | Temp 98.0°F | Ht 67.0 in | Wt 239.0 lb

## 2020-01-14 DIAGNOSIS — Z0000 Encounter for general adult medical examination without abnormal findings: Secondary | ICD-10-CM | POA: Insufficient documentation

## 2020-01-14 DIAGNOSIS — Z0001 Encounter for general adult medical examination with abnormal findings: Secondary | ICD-10-CM

## 2020-01-14 DIAGNOSIS — R7309 Other abnormal glucose: Secondary | ICD-10-CM | POA: Diagnosis not present

## 2020-01-14 DIAGNOSIS — M810 Age-related osteoporosis without current pathological fracture: Secondary | ICD-10-CM

## 2020-01-14 DIAGNOSIS — E7800 Pure hypercholesterolemia, unspecified: Secondary | ICD-10-CM | POA: Insufficient documentation

## 2020-01-14 DIAGNOSIS — I10 Essential (primary) hypertension: Secondary | ICD-10-CM | POA: Diagnosis not present

## 2020-01-14 NOTE — Patient Instructions (Addendum)
I appreciate the opportunity to provide you with care for your health and wellness. Today we discussed: overall health   Follow up: 3 weeks for ear check- flushing if needed  Labs  No referrals today  I am thinking of you and your best friends family. I do hope the holiday season can bring you some Joy and Peace during this time.  Please continue to practice social distancing to keep you, your family, and our community safe.  If you must go out, please wear a mask and practice good handwashing.  It was a pleasure to see you and I look forward to continuing to work together on your health and well-being. Please do not hesitate to call the office if you need care or have questions about your care.  Have a wonderful day and week. With Gratitude, Cherly Beach, DNP, AGNP-BC  HEALTH MAINTENANCE RECOMMENDATIONS:  It is recommended that you get at least 30 minutes of aerobic exercise at least 5 days/week (for weight loss, you may need as much as 60-90 minutes). This can be any activity that gets your heart rate up. This can be divided in 10-15 minute intervals if needed, but try and build up your endurance at least once a week.  Weight bearing exercise is also recommended twice weekly.  Eat a healthy diet with lots of vegetables, fruits and fiber.  "Colorful" foods have a lot of vitamins (ie green vegetables, tomatoes, red peppers, etc).  Limit sweet tea, regular sodas and alcoholic beverages, all of which has a lot of calories and sugar.  Up to 1 alcoholic drink daily may be beneficial for women (unless trying to lose weight, watch sugars).  Drink a lot of water.  Calcium recommendations are 1200-1500 mg daily (1500 mg for postmenopausal women or women without ovaries), and vitamin D 1000 IU daily.  This should be obtained from diet and/or supplements (vitamins), and calcium should not be taken all at once, but in divided doses.  Monthly self breast exams and yearly mammograms for women over  the age of 82 is recommended.  Sunscreen of at least SPF 30 should be used on all sun-exposed parts of the skin when outside between the hours of 10 am and 4 pm (not just when at beach or pool, but even with exercise, golf, tennis, and yard work!)  Use a sunscreen that says "broad spectrum" so it covers both UVA and UVB rays, and make sure to reapply every 1-2 hours.  Remember to change the batteries in your smoke detectors when changing your clock times in the spring and fall.  Use your seat belt every time you are in a car, and please drive safely and not be distracted with cell phones and texting while driving.

## 2020-01-14 NOTE — Assessment & Plan Note (Signed)
Normal Dexa- continue calcium and vitamin D intake and exercising

## 2020-01-14 NOTE — Progress Notes (Signed)
Health Maintenance reviewed -    There is no immunization history on file for this patient. Last Pap smear: 7 months ago at [facility] Last mammogram: 08/2019 Last colonoscopy: cologuard mailed off 1 week back  Last DEXA: 04/2019 normal  Dentist: 12/01  Ophtho: Yearly  Exercise: walking twice weekly  Smoker: no Alcohol Use: no  Other doctors caring for patient include:  Patient Care Team: Perlie Mayo, NP as PCP - General (Family Medicine)  End of Life Discussion:  Patient does not have a living will and medical power of attorney  Subjective:   HPI  Tonya Nelson is a 64 y.o. female who presents for annual comprehensive visit and follow-up on chronic medical conditions.  She has the following concerns:  Denies trouble sleeping. Denies trouble chewing or swallowing. Denies trouble with bladder or bowel habits. Denies blood in stool or urine. Denies memory changes. Denies falls or injuries -recently. Denies hearing changes. Denies changes in skin.  Review Of Systems  Review of Systems  Constitutional: Negative.   HENT: Negative.   Eyes: Negative.   Respiratory: Negative.   Cardiovascular: Negative.   Gastrointestinal: Negative.   Endocrine: Negative.   Genitourinary: Negative.   Musculoskeletal: Negative.   Skin: Negative.   Neurological: Negative.   Psychiatric/Behavioral: Negative.     Objective:   PHYSICAL EXAM:  BP 130/72 (BP Location: Right Arm, Patient Position: Sitting, Cuff Size: Normal)   Pulse 85   Temp 98 F (36.7 C) (Temporal)   Ht 5\' 7"  (1.702 m)   Wt 239 lb (108.4 kg)   SpO2 98%   BMI 37.43 kg/m   Physical Exam Vitals and nursing note reviewed.  Constitutional:      Appearance: Normal appearance. She is well-developed and well-groomed. She is obese.  HENT:     Head: Normocephalic.     Right Ear: Hearing, ear canal and external ear normal.     Left Ear: Hearing, ear canal and external ear normal.     Ears:     Comments: Moderate  cerumen buildup    Nose: Nose normal.     Mouth/Throat:     Mouth: Mucous membranes are moist.     Pharynx: Oropharynx is clear.  Eyes:     General: Lids are normal.     Extraocular Movements: Extraocular movements intact.     Conjunctiva/sclera: Conjunctivae normal.     Pupils: Pupils are equal, round, and reactive to light.  Neck:     Thyroid: No thyroid mass, thyromegaly or thyroid tenderness.     Vascular: No carotid bruit.  Cardiovascular:     Rate and Rhythm: Normal rate and regular rhythm.     Pulses: Normal pulses.          Radial pulses are 2+ on the right side and 2+ on the left side.       Dorsalis pedis pulses are 2+ on the right side and 2+ on the left side.     Heart sounds: Normal heart sounds.  Pulmonary:     Effort: Pulmonary effort is normal.     Breath sounds: Normal breath sounds.  Abdominal:     General: Abdomen is flat. Bowel sounds are normal.     Palpations: Abdomen is soft.     Tenderness: There is no abdominal tenderness. There is no right CVA tenderness or left CVA tenderness.     Hernia: No hernia is present.  Musculoskeletal:        General:  Normal range of motion.     Cervical back: Full passive range of motion without pain, normal range of motion and neck supple.     Right lower leg: No edema.     Left lower leg: No edema.     Comments: Moves all extremities, range of motion intact  Lymphadenopathy:     Cervical: No cervical adenopathy.  Skin:    General: Skin is warm and dry.     Capillary Refill: Capillary refill takes less than 2 seconds.  Neurological:     General: No focal deficit present.     Mental Status: She is alert and oriented to person, place, and time. Mental status is at baseline.     Cranial Nerves: Cranial nerves are intact.     Sensory: Sensation is intact.     Motor: Motor function is intact.     Coordination: Coordination is intact.     Gait: Gait is intact.     Deep Tendon Reflexes: Reflexes are normal and symmetric.   Psychiatric:        Attention and Perception: Attention and perception normal.        Mood and Affect: Mood and affect normal.        Speech: Speech normal.        Behavior: Behavior normal. Behavior is cooperative.        Thought Content: Thought content normal.        Cognition and Memory: Cognition and memory normal.        Judgment: Judgment normal.     Comments: Good communication good eye contact      Depression Screening  Depression screen [facility] 2/9 01/14/2020 01/14/2020 12/12/2019 07/11/2019 05/15/2019  Decreased Interest 0 0 0 0 0  Down, Depressed, Hopeless 0 0 0 0 0  PHQ - 2 Score 0 0 0 0 0  Altered sleeping - - - 0 -  Tired, decreased energy - - - 0 -  Change in appetite - - - 0 -  Feeling bad or failure about yourself  - - - 0 -  Trouble concentrating - - - 0 -  Moving slowly or fidgety/restless - - - 0 -  Suicidal thoughts - - - 0 -  PHQ-9 Score - - - 0 -  Difficult doing work/chores - - - Not difficult at all -     Falls  Fall Risk  01/14/2020 12/12/2019 07/11/2019 06/04/2019 05/15/2019  Falls in the past year? 1 0 0 1 1  Comment - - - - -  Number falls in past yr: 0 0 0 0 0  Injury with Fall? 1 0 0 1 1  Comment - - - - -  Risk for fall due to : Impaired balance/gait History of fall(s) No Fall Risks - -  Follow up Falls evaluation completed Falls evaluation completed Falls evaluation completed - -    Assessment & Plan:   1. Annual visit for general adult medical examination with abnormal findings   2. Essential hypertension   3. Osteoporosis without current pathological fracture, unspecified osteoporosis type   4. Elevated glucose   5. Elevated LDL cholesterol level     Tests ordered Orders Placed This Encounter  Procedures  . CBC  . Comprehensive metabolic panel  . Hemoglobin A1c  . Lipid panel     Plan: Please see assessment and plan per problem list above.   No orders of the defined types were placed in this encounter.   I have  personally reviewed: The patient's medical and social history Their use of alcohol, tobacco or illicit drugs Their current medications and supplements The patient's functional ability including ADLs,fall risks, home safety risks, cognitive, and hearing and visual impairment Diet and physical activities Evidence for depression or mood disorders  The patient's weight, height, BMI, and visual acuity have been recorded in the chart.  I have made referrals, counseling, and provided education to the patient based on review of the above and I have provided the patient with a written personalized care plan for preventive services.    Note: This dictation was prepared with Dragon dictation along with smaller phrase technology. Similar sounding words can be transcribed inadequately or may not be corrected upon review. Any transcriptional errors that result from this process are unintentional.      Perlie Mayo, NP   01/14/2020

## 2020-01-14 NOTE — Assessment & Plan Note (Signed)
Heart healthy diet encouraged.  Updated labs ordered.

## 2020-01-14 NOTE — Assessment & Plan Note (Signed)
History of elevated blood sugar levels we will check A1c

## 2020-01-14 NOTE — Assessment & Plan Note (Signed)
Tonya Nelson is encouraged to maintain a well balanced diet that is low in salt. Controlled, continue current medication regimen. Additionally, she is also reminded that exercise is beneficial for heart health and control of  Blood pressure. 30-60 minutes daily is recommended-walking was suggested.

## 2020-01-14 NOTE — Assessment & Plan Note (Signed)
Discussed monthly self breast exams and yearly mammograms; at least 30 minutes of aerobic activity at least 5 days/week and weight-bearing exercise 2x/week; proper sunscreen use reviewed; healthy diet, including goals of calcium and vitamin D intake and alcohol recommendations (less than or equal to 1 drink/day) reviewed; regular seatbelt use; changing batteries in smoke detectors.  Immunization recommendations discussed.  Colonoscopy recommendations reviewed.  

## 2020-01-15 ENCOUNTER — Other Ambulatory Visit: Payer: Self-pay | Admitting: Family Medicine

## 2020-01-15 LAB — COMPREHENSIVE METABOLIC PANEL
ALT: 26 IU/L (ref 0–32)
AST: 24 IU/L (ref 0–40)
Albumin/Globulin Ratio: 1.6 (ref 1.2–2.2)
Albumin: 4.2 g/dL (ref 3.8–4.8)
Alkaline Phosphatase: 44 IU/L (ref 44–121)
BUN/Creatinine Ratio: 11 — ABNORMAL LOW (ref 12–28)
BUN: 9 mg/dL (ref 8–27)
Bilirubin Total: 0.5 mg/dL (ref 0.0–1.2)
CO2: 24 mmol/L (ref 20–29)
Calcium: 9.4 mg/dL (ref 8.7–10.3)
Chloride: 103 mmol/L (ref 96–106)
Creatinine, Ser: 0.81 mg/dL (ref 0.57–1.00)
GFR calc Af Amer: 88 mL/min/{1.73_m2} (ref >59)
GFR calc non Af Amer: 76 mL/min/{1.73_m2} (ref >59)
Globulin, Total: 2.6 g/dL (ref 1.5–4.5)
Glucose: 97 mg/dL (ref 65–99)
Potassium: 4 mmol/L (ref 3.5–5.2)
Sodium: 142 mmol/L (ref 134–144)
Total Protein: 6.8 g/dL (ref 6.0–8.5)

## 2020-01-15 LAB — CBC
Hematocrit: 37.8 % (ref 34.0–46.6)
Hemoglobin: 13 g/dL (ref 11.1–15.9)
MCH: 29.2 pg (ref 26.6–33.0)
MCHC: 34.4 g/dL (ref 31.5–35.7)
MCV: 85 fL (ref 79–97)
Platelets: 197 10*3/uL (ref 150–450)
RBC: 4.45 x10E6/uL (ref 3.77–5.28)
RDW: 13 % (ref 11.7–15.4)
WBC: 6.5 10*3/uL (ref 3.4–10.8)

## 2020-01-15 LAB — HEMOGLOBIN A1C
Est. average glucose Bld gHb Est-mCnc: 108 mg/dL
Hgb A1c MFr Bld: 5.4 % (ref 4.8–5.6)

## 2020-01-15 LAB — LIPID PANEL
Chol/HDL Ratio: 4.7 ratio — ABNORMAL HIGH (ref 0.0–4.4)
Cholesterol, Total: 156 mg/dL (ref 100–199)
HDL: 33 mg/dL — ABNORMAL LOW (ref >39)
LDL Chol Calc (NIH): 99 mg/dL (ref 0–99)
Triglycerides: 135 mg/dL (ref 0–149)
VLDL Cholesterol Cal: 24 mg/dL (ref 5–40)

## 2020-01-15 MED ORDER — CARBAMIDE PEROXIDE 6.5 % OT SOLN
5.0000 [drp] | OTIC | 0 refills | Status: DC
Start: 2020-01-15 — End: 2020-06-26

## 2020-02-04 ENCOUNTER — Other Ambulatory Visit: Payer: Self-pay

## 2020-02-04 ENCOUNTER — Ambulatory Visit (INDEPENDENT_AMBULATORY_CARE_PROVIDER_SITE_OTHER): Payer: Medicare Other | Admitting: Family Medicine

## 2020-02-04 ENCOUNTER — Encounter: Payer: Self-pay | Admitting: Family Medicine

## 2020-02-04 ENCOUNTER — Ambulatory Visit: Payer: 59 | Admitting: Family Medicine

## 2020-02-04 VITALS — BP 130/76 | HR 52 | Resp 16 | Ht 66.5 in | Wt 234.0 lb

## 2020-02-04 DIAGNOSIS — Z6837 Body mass index (BMI) 37.0-37.9, adult: Secondary | ICD-10-CM

## 2020-02-04 DIAGNOSIS — E669 Obesity, unspecified: Secondary | ICD-10-CM | POA: Insufficient documentation

## 2020-02-04 DIAGNOSIS — H6123 Impacted cerumen, bilateral: Secondary | ICD-10-CM | POA: Insufficient documentation

## 2020-02-04 NOTE — Assessment & Plan Note (Signed)
Linked to HTN  Improved  Tonya Nelson is re-educated about the importance of exercise daily to help with weight management. A minumum of 30 minutes daily is recommended. Additionally, importance of healthy food choices  with portion control discussed.   Wt Readings from Last 3 Encounters:  02/04/20 234 lb 0.6 oz (106.2 kg)  01/14/20 239 lb (108.4 kg)  12/12/19 239 lb (108.4 kg)

## 2020-02-04 NOTE — Progress Notes (Signed)
Subjective:  Patient ID: Tonya Nelson, female    DOB: 02/07/1956  Age: 64 y.o. MRN: [id number]  CC:  Chief Complaint  Patient presents with  . Hearing Loss  . Cerumen Impaction      HPI  HPI Tonya Nelson is a 64 year old female patient that presents today to follow up on cerumen impaction. She has been using the Debrox and reports that she did ok with this. Still has decreased hearing though. She is willing to proceed with bilateral ear flushing today. Discussion of ENT need and flushing procedure. Verbal consent provided.   She denies any other concerns today.  Today patient denies signs and symptoms of COVID 19 infection including fever, chills, cough, shortness of breath, and headache. Past Medical, Surgical, Social History, Allergies, and Medications have been Reviewed.   Past Medical History:  Diagnosis Date  . Asthma   . Cellulitis    in toe on left foot  . Endometrial polyp 12/2014  . Essential hypertension   . Pain in left upper arm 05/15/2019  . PMB (postmenopausal bleeding) 11/18/2014  . Porokeratosis   . Superficial fungus infection of skin 06/04/2019  . Thickened endometrium 05/23/2017   Will get endo bx    Current Meds  Medication Sig  . ammonium lactate (AMLACTIN) 12 % cream Apply 1 application topically 2 (two) times daily as needed.  . bisoprolol-hydrochlorothiazide (ZIAC) 10-6.25 MG tablet Take 1 tablet by mouth daily.  . carbamide peroxide (DEBROX) 6.5 % OTIC solution Place 5 drops into both ears 3 (three) times a week.  . cholecalciferol (VITAMIN D) 1000 units tablet Take 2,000 Units by mouth daily.  . diclofenac (VOLTAREN) 25 MG EC tablet Take 1 tablet (25 mg total) by mouth 2 (two) times daily.  . furosemide (LASIX) 40 MG tablet Take 1 tablet (40 mg total) by mouth daily.    ROS:  Review of Systems  Constitutional: Negative.   HENT: Positive for hearing loss.   Eyes: Negative.   Respiratory: Negative.   Cardiovascular: Negative.    Gastrointestinal: Negative.   Genitourinary: Negative.   Musculoskeletal: Negative.   Skin: Negative.   Neurological: Negative.   Endo/Heme/Allergies: Negative.   Psychiatric/Behavioral: Negative.      Objective:   Today's Vitals: BP 130/76   Pulse (!) 52   Resp 16   Ht 5' 6.5" (1.689 m)   Wt 234 lb 0.6 oz (106.2 kg)   SpO2 99%   BMI 37.21 kg/m  Vitals with BMI 02/04/2020 01/14/2020 12/12/2019  Height 5' 6.5" 5\' 7"  5\' 6"   Weight 234 lbs 1 oz 239 lbs 239 lbs  BMI 37.21 50.38 88.28  Systolic [phone]  Diastolic 76 72 75  Pulse 52 85 92     Physical Exam Vitals and nursing note reviewed.  Constitutional:      Appearance: Normal appearance. She is well-developed and well-groomed. She is obese.  HENT:     Head: Normocephalic and atraumatic.     Right Ear: External ear normal.     Left Ear: External ear normal.     Nose: Rhinorrhea: mask in place   Eyes:     General:        Right eye: No discharge.        Left eye: No discharge.     Conjunctiva/sclera: Conjunctivae normal.  Cardiovascular:     Rate and Rhythm: Normal rate and regular rhythm.     Pulses: Normal pulses.  Heart sounds: Normal heart sounds.  Pulmonary:     Effort: Pulmonary effort is normal.     Breath sounds: Normal breath sounds.  Musculoskeletal:        General: Normal range of motion.     Cervical back: Normal range of motion and neck supple.  Skin:    General: Skin is warm.  Neurological:     General: No focal deficit present.     Mental Status: She is alert and oriented to person, place, and time.  Psychiatric:        Attention and Perception: Attention normal.        Mood and Affect: Mood normal.        Speech: Speech normal.        Behavior: Behavior normal. Behavior is cooperative.        Thought Content: Thought content normal.        Cognition and Memory: Cognition normal.        Judgment: Judgment normal.      Ear flushing: Bilaterally. Consent verbally provided. Both  ears were flushed, a good amount of cerumen was removed, however, behind the wax deeper is more, and still no visual of TM was able to be seen. Pt tolerated procedure well.   Assessment   1. Class 2 severe obesity due to excess calories with serious comorbidity and body mass index (BMI) of 37.0 to 37.9 in adult [facility])   2. Impacted cerumen of both ears     Tests ordered No orders of the defined types were placed in this encounter.   Plan: Please see assessment and plan per problem list above.   No orders of the defined types were placed in this encounter.   Patient to follow-up in 6 weeks for ear check .   Perlie Mayo, NP

## 2020-02-04 NOTE — Assessment & Plan Note (Signed)
Bilaterally. Consent verbally provided. Both ears were flushed, a good amount of cerumen was removed, however, behind the wax deeper is more, and still no visual of TM was able to be seen. Pt tolerated procedure well and reports some improvement in hearing.  Will bring back in 6 weeks after debrox use and refer to ENT if not improved.

## 2020-02-04 NOTE — Patient Instructions (Signed)
  I appreciate the opportunity to provide you with care for your health and wellness. Today we discussed: ear flushing needs  Follow up: 5-6 weeks for ear check  No labs or referrals today  Ears flushed today  Continue use of ear drops twice a week for the next month If not improved will need referral to ENT for cleaning.  Please continue to practice social distancing to keep you, your family, and our community safe.  If you must go out, please wear a mask and practice good handwashing.  It was a pleasure to see you and I look forward to continuing to work together on your health and well-being. Please do not hesitate to call the office if you need care or have questions about your care.  Have a wonderful day. With Gratitude, Cherly Beach, DNP, AGNP-BC

## 2020-02-07 ENCOUNTER — Other Ambulatory Visit: Payer: Self-pay | Admitting: Adult Health

## 2020-02-12 ENCOUNTER — Ambulatory Visit (INDEPENDENT_AMBULATORY_CARE_PROVIDER_SITE_OTHER): Payer: Medicare Other | Admitting: Family Medicine

## 2020-02-12 ENCOUNTER — Encounter: Payer: Self-pay | Admitting: Family Medicine

## 2020-02-12 ENCOUNTER — Other Ambulatory Visit: Payer: Self-pay

## 2020-02-12 VITALS — BP 134/79 | Ht 66.0 in | Wt 231.0 lb

## 2020-02-12 DIAGNOSIS — R140 Abdominal distension (gaseous): Secondary | ICD-10-CM | POA: Insufficient documentation

## 2020-02-12 NOTE — Progress Notes (Signed)
Subjective:  Patient ID: Tonya Nelson, female    DOB: [birth year]  Age: 64 y.o. MRN: [id number]  CC:  Chief Complaint  Patient presents with  . Abdominal Pain      HPI  HPI  Tonya Nelson is a 64 year old female pt of mine that comes in for an acute visit today for abdominal pains. She reports they started soon after her last visit 02/04/20. She reports that sometimes after eating her stomach starts hurting in the mid abdomen right away, bloating and pressure. It can hurts so bad sometimes it hurts to move. She reports gassy, gurgling noises. Will ease after a few hours post eating. The only thing she did different was go out to eat- she has spaghetti and reports immediate after finishing she had the symptoms happen. She denies changes on bowel or bladder habits. Denies N/V Denies fevers or chills in association..  Today patient denies signs and symptoms of COVID 19 infection including fever, chills, cough, shortness of breath, and headache. Past Medical, Surgical, Social History, Allergies, and Medications have been Reviewed.   Past Medical History:  Diagnosis Date  . Asthma   . Cellulitis    in toe on left foot  . Endometrial polyp 12/2014  . Essential hypertension   . Pain in left upper arm 05/15/2019  . PMB (postmenopausal bleeding) 11/18/2014  . Porokeratosis   . Superficial fungus infection of skin 06/04/2019  . Thickened endometrium 05/23/2017   Will get endo bx    Current Meds  Medication Sig  . ammonium lactate (AMLACTIN) 12 % cream Apply 1 application topically 2 (two) times daily as needed.  . bisoprolol-hydrochlorothiazide (ZIAC) 10-6.25 MG tablet Take 1 tablet by mouth daily.  . carbamide peroxide (DEBROX) 6.5 % OTIC solution Place 5 drops into both ears 3 (three) times a week.  . cholecalciferol (VITAMIN D) 1000 units tablet Take 2,000 Units by mouth daily.  . diclofenac (VOLTAREN) 25 MG EC tablet Take 1 tablet (25 mg total) by mouth 2 (two) times daily.  .  furosemide (LASIX) 40 MG tablet Take 1 tablet (40 mg total) by mouth daily.  . NYSTATIN powder APPLY TO THE AFFECTED AREA THREE TIMES DAILY    ROS:  Review of Systems  Constitutional: Negative.   HENT: Negative.   Eyes: Negative.   Respiratory: Negative.   Cardiovascular: Negative.   Gastrointestinal: Positive for abdominal pain.  Genitourinary: Negative.   Musculoskeletal: Negative.   Skin: Negative.   Neurological: Negative.   Endo/Heme/Allergies: Negative.   Psychiatric/Behavioral: Negative.      Objective:   Today's Vitals: BP 134/79   Ht 5\' 6"  (1.676 m)   Wt 231 lb (104.8 kg)   BMI 37.28 kg/m  Vitals with BMI 02/12/2020 02/04/2020 01/14/2020  Height 5\' 6"  5' 6.5" 5\' 7"   Weight 231 lbs 234 lbs 1 oz 239 lbs  BMI 37.3 [id number]  Systolic [id number]  Diastolic 79 76 72  Pulse - 52 85     Physical Exam Vitals and nursing note reviewed.  Constitutional:      Appearance: Normal appearance. She is well-developed and well-groomed. She is obese.  HENT:     Head: Normocephalic and atraumatic.     Right Ear: External ear normal.     Left Ear: External ear normal.     Mouth/Throat:     Comments: Mask in place  Eyes:     General:        Right eye:  No discharge.        Left eye: No discharge.     Conjunctiva/sclera: Conjunctivae normal.  Cardiovascular:     Rate and Rhythm: Normal rate and regular rhythm.     Pulses: Normal pulses.     Heart sounds: Normal heart sounds.  Pulmonary:     Effort: Pulmonary effort is normal.     Breath sounds: Normal breath sounds.  Abdominal:     General: Bowel sounds are normal.     Palpations: Abdomen is soft.     Tenderness: There is no right CVA tenderness or left CVA tenderness.  Musculoskeletal:        General: Normal range of motion.     Cervical back: Normal range of motion and neck supple.  Skin:    General: Skin is warm.  Neurological:     General: No focal deficit present.     Mental Status: She is alert and  oriented to person, place, and time.  Psychiatric:        Attention and Perception: Attention normal.        Mood and Affect: Mood normal.        Speech: Speech normal.        Behavior: Behavior normal. Behavior is cooperative.        Thought Content: Thought content normal.        Cognition and Memory: Cognition normal.        Judgment: Judgment normal.     Assessment   1. Abdominal bloating     Tests ordered No orders of the defined types were placed in this encounter.    Plan: Please see assessment and plan per problem list above.   No orders of the defined types were placed in this encounter.   Patient to follow-up in 03/18/2020    Freddy Finner, NP

## 2020-02-12 NOTE — Patient Instructions (Signed)
I appreciate the opportunity to provide you with care for your health and wellness. Today we discussed: abdominal pains/bloating  Follow up: 03/18/2020 as scheduled (call if you need [place] sooner)  No labs or referrals today  Use tums, gas x , or similar medication when your feel gassy or bloated pressure. Avoid heavy meals for the next few days and increase water intake.  Call in 1 week if not better or if bowel movements change  Happy New Year!  Please continue to practice social distancing to keep you, your family, and our community safe.  If you must go out, please wear a mask and practice good handwashing.  It was a pleasure to see you and I look forward to continuing to work together on your health and well-being. Please do not hesitate to call the office if you need care or have questions about your care.  Have a wonderful day. With Gratitude, Tereasa Coop, DNP, AGNP-BC

## 2020-02-12 NOTE — Assessment & Plan Note (Signed)
Appears to have bloating, unsure of relationship to foods. Will try treating with OTC at this time. Denies LLQ pain or changes in BM. Will monitor.  Patient acknowledged agreement and understanding of the plan.

## 2020-03-16 DIAGNOSIS — M79674 Pain in right toe(s): Secondary | ICD-10-CM | POA: Diagnosis not present

## 2020-03-16 DIAGNOSIS — M79671 Pain in right foot: Secondary | ICD-10-CM | POA: Diagnosis not present

## 2020-03-16 DIAGNOSIS — M79672 Pain in left foot: Secondary | ICD-10-CM | POA: Diagnosis not present

## 2020-03-16 DIAGNOSIS — L110 Acquired keratosis follicularis: Secondary | ICD-10-CM | POA: Diagnosis not present

## 2020-03-16 DIAGNOSIS — I739 Peripheral vascular disease, unspecified: Secondary | ICD-10-CM | POA: Diagnosis not present

## 2020-03-16 DIAGNOSIS — M79675 Pain in left toe(s): Secondary | ICD-10-CM | POA: Diagnosis not present

## 2020-03-18 ENCOUNTER — Ambulatory Visit: Payer: 59 | Admitting: Nurse Practitioner

## 2020-03-23 ENCOUNTER — Ambulatory Visit (INDEPENDENT_AMBULATORY_CARE_PROVIDER_SITE_OTHER): Payer: Medicare Other | Admitting: Nurse Practitioner

## 2020-03-23 ENCOUNTER — Other Ambulatory Visit: Payer: Self-pay

## 2020-03-23 ENCOUNTER — Encounter: Payer: Self-pay | Admitting: Nurse Practitioner

## 2020-03-23 DIAGNOSIS — H6123 Impacted cerumen, bilateral: Secondary | ICD-10-CM | POA: Diagnosis not present

## 2020-03-23 NOTE — Progress Notes (Signed)
Acute Office Visit  Subjective:    Patient ID: Tonya Nelson, female    DOB: 11/21/1955, 65 y.o.   MRN: [id number]  Chief Complaint  Patient presents with  . Follow-up    Ear recheck     HPI Patient is in today for cerumen impaction.  She was seen by Jarrett Soho in Dec. 2021, and was prescribed debrox. She has been using this.  Past Medical History:  Diagnosis Date  . Asthma   . Cellulitis    in toe on left foot  . Endometrial polyp 12/2014  . Essential hypertension   . Pain in left upper arm 05/15/2019  . PMB (postmenopausal bleeding) 11/18/2014  . Porokeratosis   . Superficial fungus infection of skin 06/04/2019  . Thickened endometrium 05/23/2017   Will get endo bx    Past Surgical History:  Procedure Laterality Date  . WISDOM TOOTH EXTRACTION      Family History  Problem Relation Age of Onset  . Osteoporosis Mother   . Stomach cancer Mother   . Cancer Mother   . Bone cancer Father   . Cancer Father   . Parkinson's disease Brother   . Alzheimer's disease Brother   . Diabetes Maternal Grandmother   . Glaucoma Maternal Grandfather     Social History   Socioeconomic History  . Marital status: Widowed    Spouse name: Not on file  . Number of children: 1  . Years of education: Not on file  . Highest education level: Some college, no degree  Occupational History  . Occupation: Retired  Tobacco Use  . Smoking status: Never Smoker  . Smokeless tobacco: Never Used  Vaping Use  . Vaping Use: Never used  Substance and Sexual Activity  . Alcohol use: No  . Drug use: No  . Sexual activity: Not Currently    Birth control/protection: Post-menopausal  Other Topics Concern  . Not on file  Social History Narrative   Lives with Son      Enjoys: music-concert, reading, being outside      Diet: eats all food groups   Caffeine: half and half tea-decaff usually   Water: 6-8 cups daily      Wears seat belt   Does not use phone while driving    Smoke detectors at  home   Right handed    Social Determinants of Health   Financial Resource Strain: Low Risk   . Difficulty of Paying Living Expenses: Not hard at all  Food Insecurity: No Food Insecurity  . Worried About Charity fundraiser in the Last Year: Never true  . Ran Out of Food in the Last Year: Never true  Transportation Needs: No Transportation Needs  . Lack of Transportation (Medical): No  . Lack of Transportation (Non-Medical): No  Physical Activity: Insufficiently Active  . Days of Exercise per Week: 2 days  . Minutes of Exercise per Session: 30 min  Stress: No Stress Concern Present  . Feeling of Stress : Not at all  Social Connections: Moderately Integrated  . Frequency of Communication with Friends and Family: More than three times a week  . Frequency of Social Gatherings with Friends and Family: Twice a week  . Attends Religious Services: More than 4 times per year  . Active Member of Clubs or Organizations: Yes  . Attends Archivist Meetings: More than 4 times per year  . Marital Status: Widowed  Intimate Partner Violence: Not At Risk  . Fear of  Current or Ex-Partner: No  . Emotionally Abused: No  . Physically Abused: No  . Sexually Abused: No    Outpatient Medications Prior to Visit  Medication Sig Dispense Refill  . ammonium lactate (AMLACTIN) 12 % cream Apply 1 application topically 2 (two) times daily as needed.    . bisoprolol-hydrochlorothiazide (ZIAC) 10-6.25 MG tablet Take 1 tablet by mouth daily.    . carbamide peroxide (DEBROX) 6.5 % OTIC solution Place 5 drops into both ears 3 (three) times a week. 15 mL 0  . cholecalciferol (VITAMIN D) 1000 units tablet Take 2,000 Units by mouth daily.    . diclofenac (VOLTAREN) 25 MG EC tablet Take 1 tablet (25 mg total) by mouth 2 (two) times daily. 30 tablet 0  . furosemide (LASIX) 40 MG tablet Take 1 tablet (40 mg total) by mouth daily. 30 tablet 1  . NYSTATIN powder APPLY TO THE AFFECTED AREA THREE TIMES DAILY 30 g  0   No facility-administered medications prior to visit.    Allergies  Allergen Reactions  . Penicillins Rash    Review of Systems  Constitutional: Negative.   HENT: Negative.        Objective:    Physical Exam Constitutional:      Appearance: Normal appearance.  HENT:     Right Ear: External ear normal. There is impacted cerumen.     Left Ear: Tympanic membrane, ear canal and external ear normal.  Neurological:     Mental Status: She is alert.     BP (!) 154/75   Pulse (!) 58   Temp 98.5 F (36.9 C)   Resp 20   Ht 5' 6.5" (1.689 m)   Wt 231 lb (104.8 kg)   SpO2 95%   BMI 36.73 kg/m  Wt Readings from Last 3 Encounters:  03/23/20 231 lb (104.8 kg)  02/12/20 231 lb (104.8 kg)  02/04/20 234 lb 0.6 oz (106.2 kg)    Health Maintenance Due  Topic Date Due  . COVID-19 Vaccine (1) Never done  . COLONOSCOPY (Pts 45-33yrs Insurance coverage will need to be confirmed)  Never done  . PNA vac Low Risk Adult (1 of 2 - PCV13) Never done    There are no preventive care reminders to display for this patient.   Lab Results  Component Value Date   TSH 1.41 04/23/2019   Lab Results  Component Value Date   WBC 6.5 01/14/2020   HGB 13.0 01/14/2020   HCT 37.8 01/14/2020   MCV 85 01/14/2020   PLT 197 01/14/2020   Lab Results  Component Value Date   NA 142 01/14/2020   K 4.0 01/14/2020   CO2 24 01/14/2020   GLUCOSE 97 01/14/2020   BUN 9 01/14/2020   CREATININE 0.81 01/14/2020   BILITOT 0.5 01/14/2020   ALKPHOS 44 01/14/2020   AST 24 01/14/2020   ALT 26 01/14/2020   PROT 6.8 01/14/2020   ALBUMIN 4.2 01/14/2020   CALCIUM 9.4 01/14/2020   Lab Results  Component Value Date   CHOL 156 01/14/2020   Lab Results  Component Value Date   HDL 33 (L) 01/14/2020   Lab Results  Component Value Date   LDLCALC 99 01/14/2020   Lab Results  Component Value Date   TRIG 135 01/14/2020   Lab Results  Component Value Date   CHOLHDL 4.7 (H) 01/14/2020   Lab  Results  Component Value Date   HGBA1C 5.4 01/14/2020       Assessment & Plan:  Problem List Items Addressed This Visit      Nervous and Auditory   Impacted cerumen of both ears    -left auditory canal has improved -right auditory canal was still impacted -irrigation performed -after irrigation, TM visible and canal clear          No orders of the defined types were placed in this encounter.    Noreene Larsson, NP

## 2020-03-23 NOTE — Assessment & Plan Note (Signed)
-  left auditory canal has improved -right auditory canal was still impacted -irrigation performed -after irrigation, TM visible and canal clear

## 2020-03-23 NOTE — Patient Instructions (Signed)
It was great seeing you today.  We irrigated your right ear, and the wax was cleared!  You will meet up with Tonya Nelson in December for a physical

## 2020-06-01 DIAGNOSIS — M79672 Pain in left foot: Secondary | ICD-10-CM | POA: Diagnosis not present

## 2020-06-01 DIAGNOSIS — M79671 Pain in right foot: Secondary | ICD-10-CM | POA: Diagnosis not present

## 2020-06-01 DIAGNOSIS — M79675 Pain in left toe(s): Secondary | ICD-10-CM | POA: Diagnosis not present

## 2020-06-01 DIAGNOSIS — L110 Acquired keratosis follicularis: Secondary | ICD-10-CM | POA: Diagnosis not present

## 2020-06-01 DIAGNOSIS — M79674 Pain in right toe(s): Secondary | ICD-10-CM | POA: Diagnosis not present

## 2020-06-01 DIAGNOSIS — I739 Peripheral vascular disease, unspecified: Secondary | ICD-10-CM | POA: Diagnosis not present

## 2020-06-22 ENCOUNTER — Encounter: Payer: Self-pay | Admitting: Nurse Practitioner

## 2020-06-22 ENCOUNTER — Ambulatory Visit (INDEPENDENT_AMBULATORY_CARE_PROVIDER_SITE_OTHER): Payer: Medicare Other | Admitting: Nurse Practitioner

## 2020-06-22 ENCOUNTER — Other Ambulatory Visit: Payer: Self-pay

## 2020-06-22 DIAGNOSIS — I10 Essential (primary) hypertension: Secondary | ICD-10-CM | POA: Diagnosis not present

## 2020-06-22 MED ORDER — 10 SERIES BP MONITOR/UPPER ARM DEVI: 1.0000 | Freq: Every day | 0 refills | Status: DC

## 2020-06-22 MED ORDER — BISOPROLOL-HYDROCHLOROTHIAZIDE 10-6.25 MG PO TABS
1.0000 | ORAL_TABLET | Freq: Every day | ORAL | 1 refills | Status: DC
Start: 2020-06-22 — End: 2020-07-15

## 2020-06-22 NOTE — Assessment & Plan Note (Addendum)
-  [facility] -if BP is not well controlled with meds from [facility], we discussed increasing the dosage -Rx. BP cuff

## 2020-06-22 NOTE — Progress Notes (Signed)
Acute Office Visit  Subjective:    Patient ID: Tonya Nelson, female    DOB: 1955-12-28, 65 y.o.   MRN: [id number]  Chief Complaint  Patient presents with  . Hypertension    Pt states pharmacy gave her a different brand of b/p medication and ever since has been feeling "off", slight headache.     HPI Patient is in today for BP check. She states her BP is not well-controlled after switching manufacturers of her BP meds.  Past Medical History:  Diagnosis Date  . Asthma   . Cellulitis    in toe on left foot  . Endometrial polyp 12/2014  . Essential hypertension   . Pain in left upper arm 05/15/2019  . PMB (postmenopausal bleeding) 11/18/2014  . Porokeratosis   . Superficial fungus infection of skin 06/04/2019  . Thickened endometrium 05/23/2017   Will get endo bx    Past Surgical History:  Procedure Laterality Date  . WISDOM TOOTH EXTRACTION      Family History  Problem Relation Age of Onset  . Osteoporosis Mother   . Stomach cancer Mother   . Cancer Mother   . Bone cancer Father   . Cancer Father   . Parkinson's disease Brother   . Alzheimer's disease Brother   . Diabetes Maternal Grandmother   . Glaucoma Maternal Grandfather     Social History   Socioeconomic History  . Marital status: Widowed    Spouse name: Not on file  . Number of children: 1  . Years of education: Not on file  . Highest education level: Some college, no degree  Occupational History  . Occupation: Retired  Tobacco Use  . Smoking status: Never Smoker  . Smokeless tobacco: Never Used  Vaping Use  . Vaping Use: Never used  Substance and Sexual Activity  . Alcohol use: No  . Drug use: No  . Sexual activity: Not Currently    Birth control/protection: Post-menopausal  Other Topics Concern  . Not on file  Social History Narrative   Lives with Son      Enjoys: music-concert, reading, being outside      Diet: eats all food groups   Caffeine: half and half tea-decaff usually   Water:  6-8 cups daily      Wears seat belt   Does not use phone while driving    Smoke detectors at home   Right handed    Social Determinants of Health   Financial Resource Strain: Low Risk   . Difficulty of Paying Living Expenses: Not hard at all  Food Insecurity: No Food Insecurity  . Worried About Charity fundraiser in the Last Year: Never true  . Ran Out of Food in the Last Year: Never true  Transportation Needs: No Transportation Needs  . Lack of Transportation (Medical): No  . Lack of Transportation (Non-Medical): No  Physical Activity: Insufficiently Active  . Days of Exercise per Week: 2 days  . Minutes of Exercise per Session: 30 min  Stress: No Stress Concern Present  . Feeling of Stress : Not at all  Social Connections: Moderately Integrated  . Frequency of Communication with Friends and Family: More than three times a week  . Frequency of Social Gatherings with Friends and Family: Twice a week  . Attends Religious Services: More than 4 times per year  . Active Member of Clubs or Organizations: Yes  . Attends Archivist Meetings: More than 4 times per year  .  Marital Status: Widowed  Intimate Partner Violence: Not At Risk  . Fear of Current or Ex-Partner: No  . Emotionally Abused: No  . Physically Abused: No  . Sexually Abused: No    Outpatient Medications Prior to Visit  Medication Sig Dispense Refill  . ammonium lactate (AMLACTIN) 12 % cream Apply 1 application topically 2 (two) times daily as needed.    . carbamide peroxide (DEBROX) 6.5 % OTIC solution Place 5 drops into both ears 3 (three) times a week. 15 mL 0  . cholecalciferol (VITAMIN D) 1000 units tablet Take 2,000 Units by mouth daily.    . diclofenac (VOLTAREN) 25 MG EC tablet Take 1 tablet (25 mg total) by mouth 2 (two) times daily. 30 tablet 0  . furosemide (LASIX) 40 MG tablet Take 1 tablet (40 mg total) by mouth daily. 30 tablet 1  . NYSTATIN powder APPLY TO THE AFFECTED AREA THREE TIMES DAILY  30 g 0  . bisoprolol-hydrochlorothiazide (ZIAC) 10-6.25 MG tablet Take 1 tablet by mouth daily.     No facility-administered medications prior to visit.    Allergies  Allergen Reactions  . Penicillins Rash    Review of Systems  Constitutional: Negative.   Respiratory: Negative.   Cardiovascular: Negative.   Musculoskeletal: Negative.   Psychiatric/Behavioral: Negative.        Objective:    Physical Exam Constitutional:      Appearance: Normal appearance. She is obese.  Cardiovascular:     Rate and Rhythm: Normal rate and regular rhythm.     Pulses: Normal pulses.     Heart sounds: Normal heart sounds.  Pulmonary:     Effort: Pulmonary effort is normal.     Breath sounds: Normal breath sounds.  Musculoskeletal:        General: Normal range of motion.  Neurological:     Mental Status: She is alert.  Psychiatric:        Mood and Affect: Mood normal.        Behavior: Behavior normal.        Thought Content: Thought content normal.        Judgment: Judgment normal.     BP (!) 151/82   Pulse 62   Temp 99.3 F (37.4 C)   Resp 20   Ht 5\' 7"  (1.702 m)   Wt 238 lb (108 kg)   SpO2 95%   BMI 37.28 kg/m  Wt Readings from Last 3 Encounters:  06/22/20 238 lb (108 kg)  03/23/20 231 lb (104.8 kg)  02/12/20 231 lb (104.8 kg)    Health Maintenance Due  Topic Date Due  . COVID-19 Vaccine (1) Never done  . COLONOSCOPY (Pts 45-66yrs Insurance coverage will need to be confirmed)  Never done  . PNA vac Low Risk Adult (1 of 2 - PCV13) Never done    There are no preventive care reminders to display for this patient.   Lab Results  Component Value Date   TSH 1.41 04/23/2019   Lab Results  Component Value Date   WBC 6.5 01/14/2020   HGB 13.0 01/14/2020   HCT 37.8 01/14/2020   MCV 85 01/14/2020   PLT 197 01/14/2020   Lab Results  Component Value Date   NA 142 01/14/2020   K 4.0 01/14/2020   CO2 24 01/14/2020   GLUCOSE 97 01/14/2020   BUN 9 01/14/2020    CREATININE 0.81 01/14/2020   BILITOT 0.5 01/14/2020   ALKPHOS 44 01/14/2020   AST 24 01/14/2020  ALT 26 01/14/2020   PROT 6.8 01/14/2020   ALBUMIN 4.2 01/14/2020   CALCIUM 9.4 01/14/2020   Lab Results  Component Value Date   CHOL 156 01/14/2020   Lab Results  Component Value Date   HDL 33 (L) 01/14/2020   Lab Results  Component Value Date   LDLCALC 99 01/14/2020   Lab Results  Component Value Date   TRIG 135 01/14/2020   Lab Results  Component Value Date   CHOLHDL 4.7 (H) 01/14/2020   Lab Results  Component Value Date   HGBA1C 5.4 01/14/2020       Assessment & Plan:   Problem List Items Addressed This Visit      Cardiovascular and Mediastinum   Essential hypertension    -[facility] -if BP is not well controlled with [facility]s [facility], we discussed increasing the dosage -Rx. BP cuff      Relevant Medications   bisoprolol-hydrochlorothiazide (ZIAC) 10-6.25 MG tablet       Meds ordered this encounter  Medications  . Blood Pressure Monitoring (10 SERIES BP MONITOR/UPPER ARM) DEVI    Sig: 1 each by Does not apply route daily. Monitor blood pressure daily.    Dispense:  1 each    Refill:  0  . bisoprolol-hydrochlorothiazide (ZIAC) 10-6.25 MG tablet    Sig: Take 1 tablet by mouth daily.    Dispense:  90 tablet    Refill:  Anthony, NP

## 2020-06-25 ENCOUNTER — Telehealth: Payer: Self-pay

## 2020-06-25 NOTE — Telephone Encounter (Signed)
Please call the pt regarding some prescriptions that need to be refilled

## 2020-06-26 ENCOUNTER — Other Ambulatory Visit: Payer: Self-pay

## 2020-06-26 DIAGNOSIS — M79622 Pain in left upper arm: Secondary | ICD-10-CM

## 2020-06-26 DIAGNOSIS — I10 Essential (primary) hypertension: Secondary | ICD-10-CM

## 2020-06-26 MED ORDER — AMMONIUM LACTATE 12 % EX CREA
TOPICAL_CREAM | Freq: Two times a day (BID) | CUTANEOUS | 1 refills | Status: AC | PRN
Start: 2020-06-26 — End: ?

## 2020-06-26 MED ORDER — DICLOFENAC SODIUM 25 MG PO TBEC: 25.0000 mg | DELAYED_RELEASE_TABLET | Freq: Two times a day (BID) | ORAL | 0 refills | Status: DC

## 2020-06-26 MED ORDER — NYSTATIN 100000 UNIT/GM EX POWD
CUTANEOUS | 0 refills | Status: DC
Start: 2020-06-26 — End: 2022-05-27

## 2020-06-26 MED ORDER — 10 SERIES BP MONITOR/UPPER ARM DEVI: 1.0000 | Freq: Every day | 0 refills | Status: DC

## 2020-06-26 MED ORDER — CARBAMIDE PEROXIDE 6.5 % OT SOLN: 5.0000 [drp] | OTIC | 0 refills | Status: DC

## 2020-06-26 MED ORDER — FUROSEMIDE 40 MG PO TABS: 40.0000 mg | ORAL_TABLET | Freq: Every day | ORAL | 1 refills | Status: DC

## 2020-06-26 NOTE — Telephone Encounter (Signed)
I spoke with pt, she needed some rx's sent to local pharmacy instead of [facility]. Prescriptions sent.

## 2020-06-26 NOTE — Telephone Encounter (Signed)
PT is calling back, as she has not heard anything

## 2020-06-30 ENCOUNTER — Encounter: Payer: Medicare Other | Admitting: Nurse Practitioner

## 2020-07-15 ENCOUNTER — Ambulatory Visit (INDEPENDENT_AMBULATORY_CARE_PROVIDER_SITE_OTHER): Payer: Medicare Other | Admitting: Nurse Practitioner

## 2020-07-15 ENCOUNTER — Other Ambulatory Visit: Payer: Self-pay

## 2020-07-15 ENCOUNTER — Encounter: Payer: Self-pay | Admitting: Nurse Practitioner

## 2020-07-15 VITALS — BP 126/75 | HR 58 | Temp 99.2°F | Resp 18 | Ht 67.0 in | Wt 234.0 lb

## 2020-07-15 DIAGNOSIS — Z139 Encounter for screening, unspecified: Secondary | ICD-10-CM | POA: Diagnosis not present

## 2020-07-15 DIAGNOSIS — E7800 Pure hypercholesterolemia, unspecified: Secondary | ICD-10-CM

## 2020-07-15 DIAGNOSIS — Z0000 Encounter for general adult medical examination without abnormal findings: Secondary | ICD-10-CM

## 2020-07-15 DIAGNOSIS — I10 Essential (primary) hypertension: Secondary | ICD-10-CM

## 2020-07-15 MED ORDER — BISOPROLOL-HYDROCHLOROTHIAZIDE 5-6.25 MG PO TABS: 1.0000 | ORAL_TABLET | Freq: Every day | ORAL | 1 refills | Status: DC

## 2020-07-15 NOTE — Assessment & Plan Note (Addendum)
-  BP well controlled today -DECREASED bisoprolol-HCTZ d/t bradycardia (HR 52) -Home BPs have been good -if we nee dto increase BP meds in future, may consider amlodipine since she has been having bradycardia; although she is asymptomatic

## 2020-07-15 NOTE — Patient Instructions (Signed)
Please have fasting labs drawn 2-3 days prior to your appointment so we can discuss the results during your office visit.  

## 2020-07-15 NOTE — Assessment & Plan Note (Signed)
Lab Results  Component Value Date   CHOL 156 01/14/2020   HDL 33 (L) 01/14/2020   LDLCALC 99 01/14/2020   TRIG 135 01/14/2020   CHOLHDL 4.7 (H) 01/14/2020  -not on statin currently -lipid panel ordered with next set of labs

## 2020-07-15 NOTE — Assessment & Plan Note (Addendum)
-  EKG completed today showed sinus bradycardia

## 2020-07-15 NOTE — Progress Notes (Signed)
Established Patient Office Visit  Subjective:  Patient ID: Tonya Nelson, female    DOB: 11-11-1955  Age: 65 y.o. MRN: [id number]  CC:  Chief Complaint  Patient presents with  . Annual Exam    Welcome to Medicare visit     HPI Tonya Nelson presents for welcome to Medicare visit.  Past Medical History:  Diagnosis Date  . Asthma   . Cellulitis    in toe on left foot  . Endometrial polyp 12/2014  . Essential hypertension   . Pain in left upper arm 05/15/2019  . PMB (postmenopausal bleeding) 11/18/2014  . Porokeratosis   . Superficial fungus infection of skin 06/04/2019  . Thickened endometrium 05/23/2017   Will get endo bx    Past Surgical History:  Procedure Laterality Date  . WISDOM TOOTH EXTRACTION      Family History  Problem Relation Age of Onset  . Osteoporosis Mother   . Stomach cancer Mother   . Cancer Mother   . Bone cancer Father   . Cancer Father   . Parkinson's disease Brother   . Alzheimer's disease Brother   . Diabetes Maternal Grandmother   . Glaucoma Maternal Grandfather     Social History   Socioeconomic History  . Marital status: Widowed    Spouse name: Not on file  . Number of children: 1  . Years of education: Not on file  . Highest education level: Some college, no degree  Occupational History  . Occupation: Retired  Tobacco Use  . Smoking status: Never Smoker  . Smokeless tobacco: Never Used  Vaping Use  . Vaping Use: Never used  Substance and Sexual Activity  . Alcohol use: No  . Drug use: No  . Sexual activity: Not Currently    Birth control/protection: Post-menopausal  Other Topics Concern  . Not on file  Social History Narrative   Lives with Son      Enjoys: music-concert, reading, being outside      Diet: eats all food groups   Caffeine: half and half tea-decaff usually   Water: 6-8 cups daily      Wears seat belt   Does not use phone while driving    Smoke detectors at home   Right handed    Social Determinants  of Health   Financial Resource Strain: Low Risk   . Difficulty of Paying Living Expenses: Not hard at all  Food Insecurity: No Food Insecurity  . Worried About Charity fundraiser in the Last Year: Never true  . Ran Out of Food in the Last Year: Never true  Transportation Needs: No Transportation Needs  . Lack of Transportation (Medical): No  . Lack of Transportation (Non-Medical): No  Physical Activity: Insufficiently Active  . Days of Exercise per Week: 2 days  . Minutes of Exercise per Session: 30 min  Stress: No Stress Concern Present  . Feeling of Stress : Not at all  Social Connections: Moderately Isolated  . Frequency of Communication with Friends and Family: Three times a week  . Frequency of Social Gatherings with Friends and Family: Twice a week  . Attends Religious Services: More than 4 times per year  . Active Member of Clubs or Organizations: No  . Attends Archivist Meetings: Never  . Marital Status: Widowed  Intimate Partner Violence: Not At Risk  . Fear of Current or Ex-Partner: No  . Emotionally Abused: No  . Physically Abused: No  . Sexually Abused: No  Outpatient Medications Prior to Visit  Medication Sig Dispense Refill  . ammonium lactate (AMLACTIN) 12 % cream Apply topically 2 (two) times daily as needed. 140 g 1  . Blood Pressure Monitoring (10 SERIES BP MONITOR/UPPER ARM) DEVI 1 each by Does not apply route daily. Monitor blood pressure daily. 1 each 0  . cholecalciferol (VITAMIN D) 1000 units tablet Take 2,000 Units by mouth daily.    . diclofenac (VOLTAREN) 25 MG EC tablet Take 1 tablet (25 mg total) by mouth 2 (two) times daily. 30 tablet 0  . furosemide (LASIX) 40 MG tablet Take 1 tablet (40 mg total) by mouth daily. 30 tablet 1  . nystatin (NYSTATIN) powder APPLY TO THE AFFECTED AREA THREE TIMES DAILY 30 g 0  . bisoprolol-hydrochlorothiazide (ZIAC) 10-6.25 MG tablet Take 1 tablet by mouth daily. 90 tablet 1  . carbamide peroxide (DEBROX)  6.5 % OTIC solution Place 5 drops into both ears 3 (three) times a week. 15 mL 0   No facility-administered medications prior to visit.    Allergies  Allergen Reactions  . Penicillins Rash    ROS Review of Systems  Constitutional: Negative.   Respiratory: Negative.   Cardiovascular: Negative.   Psychiatric/Behavioral: Negative.       Objective:    Physical Exam Constitutional:      Appearance: Normal appearance.  Cardiovascular:     Rate and Rhythm: Regular rhythm. Bradycardia present.     Pulses: Normal pulses.     Heart sounds: Normal heart sounds.  Pulmonary:     Breath sounds: Normal breath sounds.  Neurological:     Mental Status: She is alert.  Psychiatric:        Mood and Affect: Mood normal.        Behavior: Behavior normal.        Thought Content: Thought content normal.        Judgment: Judgment normal.     BP 126/75   Pulse (!) 58   Temp 99.2 F (37.3 C)   Resp 18   Ht '5\' 7"'  (1.702 m)   Wt 234 lb (106.1 kg)   SpO2 95%   BMI 36.65 kg/m  Wt Readings from Last 3 Encounters:  07/15/20 234 lb (106.1 kg)  06/22/20 238 lb (108 kg)  03/23/20 231 lb (104.8 kg)     Health Maintenance Due  Topic Date Due  . HIV Screening  Never done  . Hepatitis C Screening  Never done  . TETANUS/TDAP  Never done  . COLONOSCOPY (Pts 45-49yr Insurance coverage will need to be confirmed)  Never done    There are no preventive care reminders to display for this patient.  Lab Results  Component Value Date   TSH 1.41 04/23/2019   Lab Results  Component Value Date   WBC 6.5 01/14/2020   HGB 13.0 01/14/2020   HCT 37.8 01/14/2020   MCV 85 01/14/2020   PLT 197 01/14/2020   Lab Results  Component Value Date   NA 142 01/14/2020   K 4.0 01/14/2020   CO2 24 01/14/2020   GLUCOSE 97 01/14/2020   BUN 9 01/14/2020   CREATININE 0.81 01/14/2020   BILITOT 0.5 01/14/2020   ALKPHOS 44 01/14/2020   AST 24 01/14/2020   ALT 26 01/14/2020   PROT 6.8 01/14/2020    ALBUMIN 4.2 01/14/2020   CALCIUM 9.4 01/14/2020   Lab Results  Component Value Date   CHOL 156 01/14/2020   Lab Results  Component Value Date  HDL 33 (L) 01/14/2020   Lab Results  Component Value Date   LDLCALC 99 01/14/2020   Lab Results  Component Value Date   TRIG 135 01/14/2020   Lab Results  Component Value Date   CHOLHDL 4.7 (H) 01/14/2020   Lab Results  Component Value Date   HGBA1C 5.4 01/14/2020      Assessment & Plan:   Problem List Items Addressed This Visit      Cardiovascular and Mediastinum   Essential hypertension    -BP well controlled today -DECREASED bisoprolol-HCTZ d/t bradycardia (HR 52) -Home BPs have been good -if we nee dto increase BP meds in future, may consider amlodipine since she has been having bradycardia; although she is asymptomatic       Relevant Medications   bisoprolol-hydrochlorothiazide (ZIAC) 5-6.25 MG tablet     Other   Welcome to Medicare preventive visit - Primary    -EKG completed today showed sinus bradycardia      Relevant Orders   CBC with Differential/Platelet   CMP14+EGFR   Lipid Panel With LDL/HDL Ratio   Hepatitis C antibody   HIV Antibody (routine testing w rflx)   Hemoglobin A1c   Elevated LDL cholesterol level    Lab Results  Component Value Date   CHOL 156 01/14/2020   HDL 33 (L) 01/14/2020   LDLCALC 99 01/14/2020   TRIG 135 01/14/2020   CHOLHDL 4.7 (H) 01/14/2020  -not on statin currently -lipid panel ordered with next set of labs        Other Visit Diagnoses    Screening due       Relevant Orders   Hepatitis C antibody   HIV Antibody (routine testing w rflx)   Cologuard      Meds ordered this encounter  Medications  . bisoprolol-hydrochlorothiazide (ZIAC) 5-6.25 MG tablet    Sig: Take 1 tablet by mouth daily.    Dispense:  90 tablet    Refill:  1    Decreased dose today d/t bradycardia    Follow-up: Return in about 3 months (around 10/15/2020) for Lab follow-up (HLD).     Noreene Larsson, NP

## 2020-07-16 ENCOUNTER — Other Ambulatory Visit: Payer: Self-pay

## 2020-07-16 ENCOUNTER — Telehealth: Payer: Self-pay

## 2020-07-16 MED ORDER — BISOPROLOL-HYDROCHLOROTHIAZIDE 5-6.25 MG PO TABS: 1.0000 | ORAL_TABLET | Freq: Every day | ORAL | 1 refills | Status: DC

## 2020-07-16 NOTE — Telephone Encounter (Signed)
Patient called asked if medicine bisoprolol  can be called into [facility] instead mail order if so can can you please return patient call to let her know [phone].

## 2020-07-16 NOTE — Telephone Encounter (Signed)
Done

## 2020-07-20 ENCOUNTER — Telehealth (INDEPENDENT_AMBULATORY_CARE_PROVIDER_SITE_OTHER): Payer: Medicare Other | Admitting: Nurse Practitioner

## 2020-07-20 ENCOUNTER — Other Ambulatory Visit: Payer: Self-pay

## 2020-07-20 ENCOUNTER — Encounter: Payer: Self-pay | Admitting: Nurse Practitioner

## 2020-07-20 DIAGNOSIS — J069 Acute upper respiratory infection, unspecified: Secondary | ICD-10-CM | POA: Diagnosis not present

## 2020-07-20 MED ORDER — ALBUTEROL SULFATE HFA 108 (90 BASE) MCG/ACT IN AERS
2.0000 | INHALATION_SPRAY | Freq: Four times a day (QID) | RESPIRATORY_TRACT | 0 refills | Status: DC | PRN
Start: 2020-07-20 — End: 2023-01-24

## 2020-07-20 MED ORDER — HYDROCODONE BIT-HOMATROP MBR 5-1.5 MG/5ML PO SOLN: 5.0000 mL | Freq: Three times a day (TID) | ORAL | 0 refills | Status: DC | PRN

## 2020-07-20 MED ORDER — CORICIDIN HBP COUGH/COLD 4-30 MG PO TABS: 1.0000 | ORAL_TABLET | Freq: Four times a day (QID) | ORAL | 0 refills | Status: DC | PRN

## 2020-07-20 MED ORDER — AZITHROMYCIN 250 MG PO TABS: ORAL_TABLET | ORAL | 0 refills | Status: DC

## 2020-07-20 NOTE — Progress Notes (Signed)
Acute Office Visit  Subjective:    Patient ID: Tonya Nelson, female    DOB: 1955/07/03, 65 y.o.   MRN: [id number]  Chief Complaint  Patient presents with  . Sinus Problem    Cough congestion in head congestion in chest sore throat headache started Thursday took covid test 07-19-20 on Sunday was negative at home has tried dayquil this morning     HPI Patient is in today for sick visit.  Symptoms started on Thursday, 07/19/20 (5 days ago).  She has taken dayquil.  Symptoms per ROS.  She has hx of asthma.   Past Medical History:  Diagnosis Date  . Asthma   . Cellulitis    in toe on left foot  . Endometrial polyp 12/2014  . Essential hypertension   . Pain in left upper arm 05/15/2019  . PMB (postmenopausal bleeding) 11/18/2014  . Porokeratosis   . Superficial fungus infection of skin 06/04/2019  . Thickened endometrium 05/23/2017   Will get endo bx    Past Surgical History:  Procedure Laterality Date  . WISDOM TOOTH EXTRACTION      Family History  Problem Relation Age of Onset  . Osteoporosis Mother   . Stomach cancer Mother   . Cancer Mother   . Bone cancer Father   . Cancer Father   . Parkinson's disease Brother   . Alzheimer's disease Brother   . Diabetes Maternal Grandmother   . Glaucoma Maternal Grandfather     Social History   Socioeconomic History  . Marital status: Widowed    Spouse name: Not on file  . Number of children: 1  . Years of education: Not on file  . Highest education level: Some college, no degree  Occupational History  . Occupation: Retired  Tobacco Use  . Smoking status: Never Smoker  . Smokeless tobacco: Never Used  Vaping Use  . Vaping Use: Never used  Substance and Sexual Activity  . Alcohol use: No  . Drug use: No  . Sexual activity: Not Currently    Birth control/protection: Post-menopausal  Other Topics Concern  . Not on file  Social History Narrative   Lives with Son      Enjoys: music-concert, reading, being outside       Diet: eats all food groups   Caffeine: half and half tea-decaff usually   Water: 6-8 cups daily      Wears seat belt   Does not use phone while driving    Smoke detectors at home   Right handed    Social Determinants of Health   Financial Resource Strain: Low Risk   . Difficulty of Paying Living Expenses: Not hard at all  Food Insecurity: No Food Insecurity  . Worried About Charity fundraiser in the Last Year: Never true  . Ran Out of Food in the Last Year: Never true  Transportation Needs: No Transportation Needs  . Lack of Transportation (Medical): No  . Lack of Transportation (Non-Medical): No  Physical Activity: Insufficiently Active  . Days of Exercise per Week: 2 days  . Minutes of Exercise per Session: 30 min  Stress: No Stress Concern Present  . Feeling of Stress : Not at all  Social Connections: Moderately Isolated  . Frequency of Communication with Friends and Family: Three times a week  . Frequency of Social Gatherings with Friends and Family: Twice a week  . Attends Religious Services: More than 4 times per year  . Active Member of Clubs or  Organizations: No  . Attends Archivist Meetings: Never  . Marital Status: Widowed  Intimate Partner Violence: Not At Risk  . Fear of Current or Ex-Partner: No  . Emotionally Abused: No  . Physically Abused: No  . Sexually Abused: No    Outpatient Medications Prior to Visit  Medication Sig Dispense Refill  . ammonium lactate (AMLACTIN) 12 % cream Apply topically 2 (two) times daily as needed. 140 g 1  . bisoprolol-hydrochlorothiazide (ZIAC) 5-6.25 MG tablet Take 1 tablet by mouth daily. 90 tablet 1  . Blood Pressure Monitoring (10 SERIES BP MONITOR/UPPER ARM) DEVI 1 each by Does not apply route daily. Monitor blood pressure daily. 1 each 0  . cholecalciferol (VITAMIN D) 1000 units tablet Take 2,000 Units by mouth daily.    . diclofenac (VOLTAREN) 25 MG EC tablet Take 1 tablet (25 mg total) by mouth 2 (two)  times daily. 30 tablet 0  . furosemide (LASIX) 40 MG tablet Take 1 tablet (40 mg total) by mouth daily. 30 tablet 1  . nystatin (NYSTATIN) powder APPLY TO THE AFFECTED AREA THREE TIMES DAILY 30 g 0   No facility-administered medications prior to visit.    Allergies  Allergen Reactions  . Penicillins Rash    Review of Systems  HENT: Positive for congestion and sore throat.   Respiratory: Positive for cough and wheezing.   Neurological: Positive for headaches.       Objective:    Physical Exam  There were no vitals taken for this visit. Wt Readings from Last 3 Encounters:  07/15/20 234 lb (106.1 kg)  06/22/20 238 lb (108 kg)  03/23/20 231 lb (104.8 kg)    Health Maintenance Due  Topic Date Due  . HIV Screening  Never done  . Hepatitis C Screening  Never done  . TETANUS/TDAP  Never done  . COLONOSCOPY (Pts 45-7yrs Insurance coverage will need to be confirmed)  Never done    There are no preventive care reminders to display for this patient.   Lab Results  Component Value Date   TSH 1.41 04/23/2019   Lab Results  Component Value Date   WBC 6.5 01/14/2020   HGB 13.0 01/14/2020   HCT 37.8 01/14/2020   MCV 85 01/14/2020   PLT 197 01/14/2020   Lab Results  Component Value Date   NA 142 01/14/2020   K 4.0 01/14/2020   CO2 24 01/14/2020   GLUCOSE 97 01/14/2020   BUN 9 01/14/2020   CREATININE 0.81 01/14/2020   BILITOT 0.5 01/14/2020   ALKPHOS 44 01/14/2020   AST 24 01/14/2020   ALT 26 01/14/2020   PROT 6.8 01/14/2020   ALBUMIN 4.2 01/14/2020   CALCIUM 9.4 01/14/2020   Lab Results  Component Value Date   CHOL 156 01/14/2020   Lab Results  Component Value Date   HDL 33 (L) 01/14/2020   Lab Results  Component Value Date   LDLCALC 99 01/14/2020   Lab Results  Component Value Date   TRIG 135 01/14/2020   Lab Results  Component Value Date   CHOLHDL 4.7 (H) 01/14/2020   Lab Results  Component Value Date   HGBA1C 5.4 01/14/2020        Assessment & Plan:   Problem List Items Addressed This Visit      Respiratory   URI (upper respiratory infection)    -Rx. Azithromycin, albuterol, coricidin HBP, and hycodan cough syrup -return in 1 week if no improvement, sooner if things get worse -home  covid test negative      Relevant Medications   azithromycin (ZITHROMAX) 250 MG tablet       Meds ordered this encounter  Medications  . azithromycin (ZITHROMAX) 250 MG tablet    Sig: Please dispense as a z-pack    Dispense:  6 tablet    Refill:  0  . albuterol (VENTOLIN HFA) 108 (90 Base) MCG/ACT inhaler    Sig: Inhale 2 puffs into the lungs every 6 (six) hours as needed for wheezing or shortness of breath.    Dispense:  8 g    Refill:  0  . Chlorpheniramine-DM (CORICIDIN COUGH/COLD) 4-30 MG TABS    Sig: Take 1 tablet by mouth every 6 (six) hours as needed.    Dispense:  28 tablet    Refill:  0  . HYDROcodone bit-homatropine (HYCODAN) 5-1.5 MG/5ML syrup    Sig: Take 5 mLs by mouth every 8 (eight) hours as needed for cough.    Dispense:  120 mL    Refill:  0   Date:  07/20/2020   Location of Patient: Home Location of Provider: Office Consent was obtain for visit to be over via telehealth. I verified that I am speaking with the correct person using two identifiers.  I connected with  Tonya Nelson on 07/20/20 via telephone and verified that I am speaking with the correct person using two identifiers.   I discussed the limitations of evaluation and management by telemedicine. The patient expressed understanding and agreed to proceed.  Time spent: 9 minutes   Noreene Larsson, NP

## 2020-07-20 NOTE — Assessment & Plan Note (Signed)
-  Rx. Azithromycin, albuterol, coricidin HBP, and hycodan cough syrup -return in 1 week if no improvement, sooner if things get worse -home covid test negative

## 2020-07-29 ENCOUNTER — Telehealth: Payer: Self-pay

## 2020-07-29 NOTE — Telephone Encounter (Signed)
Is she using her albuterol? Let's set her up for a visit.

## 2020-07-29 NOTE — Telephone Encounter (Signed)
Patient called said she is wheezing now was just seen June 6.  Call back # [phone].

## 2020-07-29 NOTE — Telephone Encounter (Signed)
error 

## 2020-07-30 ENCOUNTER — Other Ambulatory Visit: Payer: Self-pay

## 2020-07-30 ENCOUNTER — Encounter: Payer: Self-pay | Admitting: Nurse Practitioner

## 2020-07-30 ENCOUNTER — Ambulatory Visit (INDEPENDENT_AMBULATORY_CARE_PROVIDER_SITE_OTHER): Payer: Medicare Other | Admitting: Nurse Practitioner

## 2020-07-30 DIAGNOSIS — R062 Wheezing: Secondary | ICD-10-CM

## 2020-07-30 MED ORDER — PREDNISONE 20 MG PO TABS: 20.0000 mg | ORAL_TABLET | Freq: Every day | ORAL | 0 refills | Status: DC

## 2020-07-30 NOTE — Progress Notes (Signed)
Acute Office Visit  Subjective:    Patient ID: Tonya Nelson, female    DOB: 06/17/1955, 65 y.o.   MRN: [id number]  Chief Complaint  Patient presents with   Wheezing    X 2 weeks, started out with fever of 100.4 and coughing and the coughing has gotten better but still wheezing, mostly at night     Wheezing  Pertinent negatives include no coughing or shortness of breath.  Patient is in today for wheezing. She was treated for URI via televisit on 07/20/20 with a z-pack, coricidin HBP, and hycodan cough syrup. She also received albuterol  Past Medical History:  Diagnosis Date   Asthma    Cellulitis    in toe on left foot   Endometrial polyp 12/2014   Essential hypertension    Pain in left upper arm 05/15/2019   PMB (postmenopausal bleeding) 11/18/2014   Porokeratosis    Superficial fungus infection of skin 06/04/2019   Thickened endometrium 05/23/2017   Will get endo bx    Past Surgical History:  Procedure Laterality Date   WISDOM TOOTH EXTRACTION      Family History  Problem Relation Age of Onset   Osteoporosis Mother    Stomach cancer Mother    Cancer Mother    Bone cancer Father    Cancer Father    Parkinson's disease Brother    Alzheimer's disease Brother    Diabetes Maternal Grandmother    Glaucoma Maternal Grandfather     Social History   Socioeconomic History   Marital status: Widowed    Spouse name: Not on file   Number of children: 1   Years of education: Not on file   Highest education level: Some college, no degree  Occupational History   Occupation: Retired  Tobacco Use   Smoking status: Never   Smokeless tobacco: Never  Vaping Use   Vaping Use: Never used  Substance and Sexual Activity   Alcohol use: No   Drug use: No   Sexual activity: Not Currently    Birth control/protection: Post-menopausal  Other Topics Concern   Not on file  Social History Narrative   Lives with Son      Enjoys: music-concert, reading, being outside      Diet:  eats all food groups   Caffeine: half and half tea-decaff usually   Water: 6-8 cups daily      Wears seat belt   Does not use phone while driving    Smoke detectors at home   Right handed    Social Determinants of Health   Financial Resource Strain: Low Risk    Difficulty of Paying Living Expenses: Not hard at all  Food Insecurity: No Food Insecurity   Worried About Charity fundraiser in the Last Year: Never true   [place] in the Last Year: Never true  Transportation Needs: No Transportation Needs   Lack of Transportation (Medical): No   Lack of Transportation (Non-Medical): No  Physical Activity: Insufficiently Active   Days of Exercise per Week: 2 days   Minutes of Exercise per Session: 30 min  Stress: No Stress Concern Present   Feeling of Stress : Not at all  Social Connections: Moderately Isolated   Frequency of Communication with Friends and Family: Three times a week   Frequency of Social Gatherings with Friends and Family: Twice a week   Attends Religious Services: More than 4 times per year   Active Member of Clubs or  Organizations: No   Attends Archivist Meetings: Never   Marital Status: Widowed  Human resources officer Violence: Not At Risk   Fear of Current or Ex-Partner: No   Emotionally Abused: No   Physically Abused: No   Sexually Abused: No    Outpatient Medications Prior to Visit  Medication Sig Dispense Refill   albuterol (VENTOLIN HFA) 108 (90 Base) MCG/ACT inhaler Inhale 2 puffs into the lungs every 6 (six) hours as needed for wheezing or shortness of breath. 8 g 0   ammonium lactate (AMLACTIN) 12 % cream Apply topically 2 (two) times daily as needed. 140 g 1   bisoprolol-hydrochlorothiazide (ZIAC) 5-6.25 MG tablet Take 1 tablet by mouth daily. 90 tablet 1   Blood Pressure Monitoring (10 SERIES BP MONITOR/UPPER ARM) DEVI 1 each by Does not apply route daily. Monitor blood pressure daily. 1 each 0   cholecalciferol (VITAMIN D) 1000 units  tablet Take 2,000 Units by mouth daily.     diclofenac (VOLTAREN) 25 MG EC tablet Take 1 tablet (25 mg total) by mouth 2 (two) times daily. 30 tablet 0   furosemide (LASIX) 40 MG tablet Take 1 tablet (40 mg total) by mouth daily. 30 tablet 1   HYDROcodone bit-homatropine (HYCODAN) 5-1.5 MG/5ML syrup Take 5 mLs by mouth every 8 (eight) hours as needed for cough. 120 mL 0   nystatin (NYSTATIN) powder APPLY TO THE AFFECTED AREA THREE TIMES DAILY 30 g 0   azithromycin (ZITHROMAX) 250 MG tablet Please dispense as a z-pack (Patient not taking: Reported on 07/30/2020) 6 tablet 0   Chlorpheniramine-DM (CORICIDIN COUGH/COLD) 4-30 MG TABS Take 1 tablet by mouth every 6 (six) hours as needed. (Patient not taking: Reported on 07/30/2020) 28 tablet 0   No facility-administered medications prior to visit.    Allergies  Allergen Reactions   Penicillins Rash    Review of Systems  Constitutional: Negative.   Respiratory:  Positive for wheezing. Negative for cough, chest tightness and shortness of breath.        -gets SOB at bedtime, but doing well now  Cardiovascular: Negative.   Psychiatric/Behavioral: Negative.        Objective:    Physical Exam Constitutional:      Appearance: Normal appearance.  Cardiovascular:     Rate and Rhythm: Normal rate and regular rhythm.     Pulses: Normal pulses.     Heart sounds: Normal heart sounds.  Pulmonary:     Effort: Pulmonary effort is normal.     Breath sounds: Normal breath sounds.  Neurological:     Mental Status: She is alert.  Psychiatric:        Mood and Affect: Mood normal.        Behavior: Behavior normal.        Thought Content: Thought content normal.        Judgment: Judgment normal.    BP (!) 159/85   Pulse 68   Temp 98.6 F (37 C) (Oral)   Resp 16   Ht 5\' 7"  (1.702 m)   Wt 232 lb 12.8 oz (105.6 kg)   SpO2 96%   BMI 36.46 kg/m  Wt Readings from Last 3 Encounters:  07/30/20 232 lb 12.8 oz (105.6 kg)  07/15/20 234 lb (106.1 kg)   06/22/20 238 lb (108 kg)    Health Maintenance Due  Topic Date Due   HIV Screening  Never done   Hepatitis C Screening  Never done   TETANUS/TDAP  Never done  COLONOSCOPY (Pts 45-4yrs Insurance coverage will need to be confirmed)  Never done    There are no preventive care reminders to display for this patient.   Lab Results  Component Value Date   TSH 1.41 04/23/2019   Lab Results  Component Value Date   WBC 6.5 01/14/2020   HGB 13.0 01/14/2020   HCT 37.8 01/14/2020   MCV 85 01/14/2020   PLT 197 01/14/2020   Lab Results  Component Value Date   NA 142 01/14/2020   K 4.0 01/14/2020   CO2 24 01/14/2020   GLUCOSE 97 01/14/2020   BUN 9 01/14/2020   CREATININE 0.81 01/14/2020   BILITOT 0.5 01/14/2020   ALKPHOS 44 01/14/2020   AST 24 01/14/2020   ALT 26 01/14/2020   PROT 6.8 01/14/2020   ALBUMIN 4.2 01/14/2020   CALCIUM 9.4 01/14/2020   Lab Results  Component Value Date   CHOL 156 01/14/2020   Lab Results  Component Value Date   HDL 33 (L) 01/14/2020   Lab Results  Component Value Date   LDLCALC 99 01/14/2020   Lab Results  Component Value Date   TRIG 135 01/14/2020   Lab Results  Component Value Date   CHOLHDL 4.7 (H) 01/14/2020   Lab Results  Component Value Date   HGBA1C 5.4 01/14/2020       Assessment & Plan:   Problem List Items Addressed This Visit       Other   Wheezing    -was treated for URI on 07/20/20 with albuterol, z-pack, coricidin HBP, and hycodan cough syrup. -fever has resolved, has wheezing worse at night -she states she has only been using her albuterol once per day in the AM and she is wheezing and getting SOB at night -Rx. Prednisone -encouraged her to use her albuterol PRN up to 4x per day -O2 sat 96% now       Relevant Medications   predniSONE (DELTASONE) 20 MG tablet     Meds ordered this encounter  Medications   predniSONE (DELTASONE) 20 MG tablet    Sig: Take 1 tablet (20 mg total) by mouth daily with  breakfast.    Dispense:  10 tablet    Refill:  0     Noreene Larsson, NP

## 2020-07-30 NOTE — Assessment & Plan Note (Addendum)
-  was treated for URI on 07/20/20 with albuterol, z-pack, coricidin HBP, and hycodan cough syrup. -fever has resolved, has wheezing worse at night -she states she has only been using her albuterol once per day in the AM and she is wheezing and getting SOB at night -Rx. Prednisone -encouraged her to use her albuterol PRN up to 4x per day -O2 sat 96% now

## 2020-08-03 NOTE — Telephone Encounter (Signed)
Called left patient message to return call to schedule an appointment.

## 2020-08-31 DIAGNOSIS — M79675 Pain in left toe(s): Secondary | ICD-10-CM | POA: Diagnosis not present

## 2020-08-31 DIAGNOSIS — I739 Peripheral vascular disease, unspecified: Secondary | ICD-10-CM | POA: Diagnosis not present

## 2020-08-31 DIAGNOSIS — M79671 Pain in right foot: Secondary | ICD-10-CM | POA: Diagnosis not present

## 2020-08-31 DIAGNOSIS — M79672 Pain in left foot: Secondary | ICD-10-CM | POA: Diagnosis not present

## 2020-08-31 DIAGNOSIS — L110 Acquired keratosis follicularis: Secondary | ICD-10-CM | POA: Diagnosis not present

## 2020-08-31 DIAGNOSIS — M79674 Pain in right toe(s): Secondary | ICD-10-CM | POA: Diagnosis not present

## 2020-09-22 ENCOUNTER — Ambulatory Visit: Payer: Medicare Other | Admitting: Family Medicine

## 2020-10-15 ENCOUNTER — Ambulatory Visit: Payer: Medicare Other | Admitting: Family Medicine

## 2020-11-25 DIAGNOSIS — M79675 Pain in left toe(s): Secondary | ICD-10-CM | POA: Diagnosis not present

## 2020-11-25 DIAGNOSIS — M79671 Pain in right foot: Secondary | ICD-10-CM | POA: Diagnosis not present

## 2020-11-25 DIAGNOSIS — M79674 Pain in right toe(s): Secondary | ICD-10-CM | POA: Diagnosis not present

## 2020-11-25 DIAGNOSIS — I739 Peripheral vascular disease, unspecified: Secondary | ICD-10-CM | POA: Diagnosis not present

## 2020-11-25 DIAGNOSIS — L110 Acquired keratosis follicularis: Secondary | ICD-10-CM | POA: Diagnosis not present

## 2020-11-25 DIAGNOSIS — M79672 Pain in left foot: Secondary | ICD-10-CM | POA: Diagnosis not present

## 2020-12-07 ENCOUNTER — Other Ambulatory Visit: Payer: Self-pay

## 2020-12-07 ENCOUNTER — Ambulatory Visit (INDEPENDENT_AMBULATORY_CARE_PROVIDER_SITE_OTHER): Payer: Medicare Other | Admitting: Internal Medicine

## 2020-12-07 ENCOUNTER — Encounter: Payer: Self-pay | Admitting: Internal Medicine

## 2020-12-07 DIAGNOSIS — Z20822 Contact with and (suspected) exposure to covid-19: Secondary | ICD-10-CM

## 2020-12-07 DIAGNOSIS — J029 Acute pharyngitis, unspecified: Secondary | ICD-10-CM | POA: Diagnosis not present

## 2020-12-07 NOTE — Progress Notes (Signed)
Virtual Visit via Telephone Note   This visit type was conducted due to national recommendations for restrictions regarding the COVID-19 Pandemic (e.g. social distancing) in an effort to limit this patient's exposure and mitigate transmission in our community.  Due to her co-morbid illnesses, this patient is at least at moderate risk for complications without adequate follow up.  This format is felt to be most appropriate for this patient at this time.  The patient did not have access to video technology/had technical difficulties with video requiring transitioning to audio format only (telephone).  All issues noted in this document were discussed and addressed.  No physical exam could be performed with this format.  Evaluation Performed:  Follow-up visit  Date:  12/07/2020   ID:  FEVEN ALDERFER, DOB 12-Sep-1955, MRN [id number]  Patient Location: Home Provider Location: Office/Clinic  Participants: Patient Location of Patient: Home Location of Provider: Telehealth Consent was obtain for visit to be over via telehealth. I verified that I am speaking with the correct person using two identifiers.  PCP:  Noreene Larsson, NP   Chief Complaint: Fever, chills, muscle aches  History of Present Illness:    Tonya Nelson is a 65 y.o. female who has a televisit for complaint of fever, chills and muscle aches since yesterday.  She tried taking Aleve which helped with fever and muscle aches.  She denies any sore throat, nasal congestion, dyspnea, wheezing, nausea, vomiting or abdominal pain.  She has not had COVID test.  She has not had COVID-vaccine.  Denies any recent sick contacts.  The patient does have symptoms concerning for COVID-19 infection (fever, chills, cough, or new shortness of breath).   Past Medical, Surgical, Social History, Allergies, and Medications have been Reviewed.  Past Medical History:  Diagnosis Date   Asthma    Cellulitis    in toe on left foot   Endometrial polyp  12/2014   Essential hypertension    Pain in left upper arm 05/15/2019   PMB (postmenopausal bleeding) 11/18/2014   Porokeratosis    Superficial fungus infection of skin 06/04/2019   Thickened endometrium 05/23/2017   Will get endo bx   Past Surgical History:  Procedure Laterality Date   WISDOM TOOTH EXTRACTION       Current Meds  Medication Sig   albuterol (VENTOLIN HFA) 108 (90 Base) MCG/ACT inhaler Inhale 2 puffs into the lungs every 6 (six) hours as needed for wheezing or shortness of breath.   ammonium lactate (AMLACTIN) 12 % cream Apply topically 2 (two) times daily as needed.   bisoprolol-hydrochlorothiazide (ZIAC) 5-6.25 MG tablet Take 1 tablet by mouth daily.   Blood Pressure Monitoring (10 SERIES BP MONITOR/UPPER ARM) DEVI 1 each by Does not apply route daily. Monitor blood pressure daily.   cholecalciferol (VITAMIN D) 1000 units tablet Take 2,000 Units by mouth daily.   diclofenac (VOLTAREN) 25 MG EC tablet Take 1 tablet (25 mg total) by mouth 2 (two) times daily.   furosemide (LASIX) 40 MG tablet Take 1 tablet (40 mg total) by mouth daily.   nystatin (NYSTATIN) powder APPLY TO THE AFFECTED AREA THREE TIMES DAILY   [DISCONTINUED] HYDROcodone bit-homatropine (HYCODAN) 5-1.5 MG/5ML syrup Take 5 mLs by mouth every 8 (eight) hours as needed for cough.     Allergies:   Penicillins   ROS:   Please see the history of present illness.     All other systems reviewed and are negative.   Labs/Other Tests and Data  Reviewed:    Recent Labs: 01/14/2020: ALT 26; BUN 9; Creatinine, Ser 0.81; Hemoglobin 13.0; Platelets 197; Potassium 4.0; Sodium 142   Recent Lipid Panel Lab Results  Component Value Date/Time   CHOL 156 01/14/2020 09:05 AM   TRIG 135 01/14/2020 09:05 AM   HDL 33 (L) 01/14/2020 09:05 AM   CHOLHDL 4.7 (H) 01/14/2020 09:05 AM   CHOLHDL 4.4 04/23/2019 08:46 AM   LDLCALC 99 01/14/2020 09:05 AM   LDLCALC 113 (H) 04/23/2019 08:46 AM    Wt Readings from Last 3  Encounters:  07/30/20 232 lb 12.8 oz (105.6 kg)  07/15/20 234 lb (106.1 kg)  06/22/20 238 lb (108 kg)       ASSESSMENT & PLAN:    Suspected COVID 19 infection Likely viral URTI Check COVID RT-PCR and rapid flu Tylenol as needed for fever/myalgias Mucinex or Robitussin as needed for cough   Time:   Today, I have spent 7 minutes reviewing the chart, including problem list, medications, and with the patient with telehealth technology discussing the above problems.   Medication Adjustments/Labs and Tests Ordered: Current medicines are reviewed at length with the patient today.  Concerns regarding medicines are outlined above.   Tests Ordered: No orders of the defined types were placed in this encounter.   Medication Changes: No orders of the defined types were placed in this encounter.    Note: This dictation was prepared with Dragon dictation along with smaller phrase technology. Similar sounding words can be transcribed inadequately or may not be corrected upon review. Any transcriptional errors that result from this process are unintentional.      Disposition:  Follow up  Signed, Lindell Spar, MD  12/07/2020 4:34 PM     [facility]

## 2020-12-07 NOTE — Patient Instructions (Signed)
Please come to office for Rapid flu and COVID tests.

## 2020-12-08 ENCOUNTER — Telehealth: Payer: Self-pay

## 2020-12-08 ENCOUNTER — Ambulatory Visit (INDEPENDENT_AMBULATORY_CARE_PROVIDER_SITE_OTHER): Payer: Medicare Other | Admitting: *Deleted

## 2020-12-08 DIAGNOSIS — Z20822 Contact with and (suspected) exposure to covid-19: Secondary | ICD-10-CM

## 2020-12-08 DIAGNOSIS — J069 Acute upper respiratory infection, unspecified: Secondary | ICD-10-CM | POA: Diagnosis not present

## 2020-12-08 LAB — POCT INFLUENZA A/B
Influenza A, POC: NEGATIVE
Influenza B, POC: NEGATIVE

## 2020-12-08 NOTE — Telephone Encounter (Signed)
Patient calling for flu results. Please call patient at [phone]

## 2020-12-08 NOTE — Telephone Encounter (Signed)
Called and notified patient of results. Patient verbalized understanding

## 2020-12-09 ENCOUNTER — Telehealth: Payer: Self-pay

## 2020-12-09 NOTE — Telephone Encounter (Signed)
Patient calling still feeling terrible, throat hurts so bad, can hardly swollen.  Asked if nurse will give her a call at [phone] was just had phone visit 10/24.

## 2020-12-10 ENCOUNTER — Telehealth: Payer: Self-pay | Admitting: Nurse Practitioner

## 2020-12-10 LAB — NOVEL CORONAVIRUS, NAA

## 2020-12-10 MED ORDER — AZITHROMYCIN 250 MG PO TABS: ORAL_TABLET | ORAL | 0 refills | Status: AC

## 2020-12-10 NOTE — Telephone Encounter (Signed)
Pt advised azithromycin sent in to let us know if this doesn't get any better with verbal understanding

## 2020-12-10 NOTE — Addendum Note (Signed)
Addended byIhor Dow on: 12/10/2020 08:15 AM   Modules accepted: Orders

## 2020-12-10 NOTE — Telephone Encounter (Signed)
Awaiting providers response

## 2020-12-10 NOTE — Telephone Encounter (Signed)
Pt called in again for a call back to speak with nurse about symptoms, still not feeling well . Asked if nurse will give her a call at [phone] was just had phone visit 10/24.

## 2020-12-17 ENCOUNTER — Telehealth: Payer: Self-pay | Admitting: Nurse Practitioner

## 2020-12-17 ENCOUNTER — Telehealth: Payer: Medicare Other | Admitting: Internal Medicine

## 2020-12-17 ENCOUNTER — Other Ambulatory Visit: Payer: Self-pay

## 2020-12-17 NOTE — Telephone Encounter (Signed)
Pt called in about stomach issues caused from the Coal she was prescribed. Pt wants to discuss issues and see if she can get a prescription to help stomach

## 2020-12-17 NOTE — Telephone Encounter (Signed)
Pt will need virtual visit to discuss with provider

## 2020-12-22 ENCOUNTER — Ambulatory Visit (INDEPENDENT_AMBULATORY_CARE_PROVIDER_SITE_OTHER): Payer: Medicare Other | Admitting: Internal Medicine

## 2020-12-22 ENCOUNTER — Other Ambulatory Visit: Payer: Self-pay

## 2020-12-22 ENCOUNTER — Encounter: Payer: Self-pay | Admitting: Internal Medicine

## 2020-12-22 DIAGNOSIS — R053 Chronic cough: Secondary | ICD-10-CM | POA: Diagnosis not present

## 2020-12-22 DIAGNOSIS — R197 Diarrhea, unspecified: Secondary | ICD-10-CM

## 2020-12-22 MED ORDER — BENZONATATE 100 MG PO CAPS
100.0000 mg | ORAL_CAPSULE | Freq: Two times a day (BID) | ORAL | 0 refills | Status: DC | PRN
Start: 2020-12-22 — End: 2021-01-20

## 2020-12-22 NOTE — Patient Instructions (Signed)
Please take Tessalon for cough.  Please maintain adequate hydration by taking at least 64 ounces of fluid in a day, may need additional fluid if you have diarrhea-at least 500 mL for each watery BM.  Please contact us if you have fever, chills or blood in stool.

## 2020-12-22 NOTE — Progress Notes (Signed)
Virtual Visit via Telephone Note   This visit type was conducted due to national recommendations for restrictions regarding the COVID-19 Pandemic (e.g. social distancing) in an effort to limit this patient's exposure and mitigate transmission in our community.  Due to her co-morbid illnesses, this patient is at least at moderate risk for complications without adequate follow up.  This format is felt to be most appropriate for this patient at this time.  The patient did not have access to video technology/had technical difficulties with video requiring transitioning to audio format only (telephone).  All issues noted in this document were discussed and addressed.  No physical exam could be performed with this format.  Evaluation Performed:  Follow-up visit  Date:  12/22/2020   ID:  Tonya Nelson, DOB March 19, 1955, MRN [id number]  Patient Location: Home Provider Location: Office/Clinic  Participants: Patient Location of Patient: Home Location of Provider: Telehealth Consent was obtain for visit to be over via telehealth. I verified that I am speaking with the correct person using two identifiers.  PCP:  Noreene Larsson, NP   Chief Complaint: Diarrhea  History of Present Illness:    Tonya Nelson is a 65 y.o. female who has a televisit for complaint of diarrhea since she started taking azithromycin.  She has completed course of azithromycin now, but continues to have watery diarrhea.  She denies any fever, chills, melena or hematochezia.  Denies any nausea or vomiting currently.  She is able to tolerate p.o. intake for now.  She continues to have cough, but denies any dyspnea or wheezing currently.  She recently had URTI and other symptoms have resolved now.  The patient does not have symptoms concerning for COVID-19 infection (fever, chills, cough, or new shortness of breath).   Past Medical, Surgical, Social History, Allergies, and Medications have been Reviewed.  Past Medical  History:  Diagnosis Date   Asthma    Cellulitis    in toe on left foot   Endometrial polyp 12/2014   Essential hypertension    Pain in left upper arm 05/15/2019   PMB (postmenopausal bleeding) 11/18/2014   Porokeratosis    Superficial fungus infection of skin 06/04/2019   Thickened endometrium 05/23/2017   Will get endo bx   Past Surgical History:  Procedure Laterality Date   WISDOM TOOTH EXTRACTION       Current Meds  Medication Sig   albuterol (VENTOLIN HFA) 108 (90 Base) MCG/ACT inhaler Inhale 2 puffs into the lungs every 6 (six) hours as needed for wheezing or shortness of breath.   ammonium lactate (AMLACTIN) 12 % cream Apply topically 2 (two) times daily as needed.   bisoprolol-hydrochlorothiazide (ZIAC) 5-6.25 MG tablet Take 1 tablet by mouth daily.   Blood Pressure Monitoring (10 SERIES BP MONITOR/UPPER ARM) DEVI 1 each by Does not apply route daily. Monitor blood pressure daily.   cholecalciferol (VITAMIN D) 1000 units tablet Take 2,000 Units by mouth daily.   diclofenac (VOLTAREN) 25 MG EC tablet Take 1 tablet (25 mg total) by mouth 2 (two) times daily.   furosemide (LASIX) 40 MG tablet Take 1 tablet (40 mg total) by mouth daily.   nystatin (NYSTATIN) powder APPLY TO THE AFFECTED AREA THREE TIMES DAILY     Allergies:   Penicillins   ROS:   Please see the history of present illness.     All other systems reviewed and are negative.   Labs/Other Tests and Data Reviewed:    Recent Labs: 01/14/2020:  ALT 26; BUN 9; Creatinine, Ser 0.81; Hemoglobin 13.0; Platelets 197; Potassium 4.0; Sodium 142   Recent Lipid Panel Lab Results  Component Value Date/Time   CHOL 156 01/14/2020 09:05 AM   TRIG 135 01/14/2020 09:05 AM   HDL 33 (L) 01/14/2020 09:05 AM   CHOLHDL 4.7 (H) 01/14/2020 09:05 AM   CHOLHDL 4.4 04/23/2019 08:46 AM   LDLCALC 99 01/14/2020 09:05 AM   LDLCALC 113 (H) 04/23/2019 08:46 AM    Wt Readings from Last 3 Encounters:  07/30/20 232 lb 12.8 oz (105.6 kg)   07/15/20 234 lb (106.1 kg)  06/22/20 238 lb (108 kg)     ASSESSMENT & PLAN:    Chronic cough Could be post infectious cough Tessalon as needed  Watery diarrhea/gastroenteritis Recent antibiotic exposure for URTI will check C. difficile toxin assay Advised for proper hydration for now Avoid antidiarrheals for now  Time:   Today, I have spent 13 minutes reviewing the chart, including problem list, medications, and with the patient with telehealth technology discussing the above problems.   Medication Adjustments/Labs and Tests Ordered: Current medicines are reviewed at length with the patient today.  Concerns regarding medicines are outlined above.   Tests Ordered: No orders of the defined types were placed in this encounter.   Medication Changes: No orders of the defined types were placed in this encounter.    Note: This dictation was prepared with Dragon dictation along with smaller phrase technology. Similar sounding words can be transcribed inadequately or may not be corrected upon review. Any transcriptional errors that result from this process are unintentional.      Disposition:  Follow up  Signed, Lindell Spar, MD  12/22/2020 11:59 AM     [facility]

## 2021-01-12 DIAGNOSIS — E7800 Pure hypercholesterolemia, unspecified: Secondary | ICD-10-CM | POA: Diagnosis not present

## 2021-01-12 DIAGNOSIS — Z139 Encounter for screening, unspecified: Secondary | ICD-10-CM | POA: Diagnosis not present

## 2021-01-12 DIAGNOSIS — Z0000 Encounter for general adult medical examination without abnormal findings: Secondary | ICD-10-CM | POA: Diagnosis not present

## 2021-01-13 LAB — HIV ANTIBODY (ROUTINE TESTING W REFLEX): HIV Screen 4th Generation wRfx: NONREACTIVE

## 2021-01-13 LAB — CBC WITH DIFFERENTIAL/PLATELET
Basophils Absolute: 0 10*3/uL (ref 0.0–0.2)
Basos: 1 %
EOS (ABSOLUTE): 0.2 10*3/uL (ref 0.0–0.4)
Eos: 4 %
Hematocrit: 39.4 % (ref 34.0–46.6)
Hemoglobin: 13.5 g/dL (ref 11.1–15.9)
Immature Grans (Abs): 0 10*3/uL (ref 0.0–0.1)
Immature Granulocytes: 0 %
Lymphocytes Absolute: 2.2 10*3/uL (ref 0.7–3.1)
Lymphs: 36 %
MCH: 29.1 pg (ref 26.6–33.0)
MCHC: 34.3 g/dL (ref 31.5–35.7)
MCV: 85 fL (ref 79–97)
Monocytes Absolute: 0.4 10*3/uL (ref 0.1–0.9)
Monocytes: 7 %
Neutrophils Absolute: 3.2 10*3/uL (ref 1.4–7.0)
Neutrophils: 52 %
Platelets: 209 10*3/uL (ref 150–450)
RBC: 4.64 x10E6/uL (ref 3.77–5.28)
RDW: 13.4 % (ref 11.7–15.4)
WBC: 6 10*3/uL (ref 3.4–10.8)

## 2021-01-13 LAB — LIPID PANEL WITH LDL/HDL RATIO
Cholesterol, Total: 164 mg/dL (ref 100–199)
HDL: 30 mg/dL — ABNORMAL LOW (ref >39)
LDL Chol Calc (NIH): 100 mg/dL — ABNORMAL HIGH (ref 0–99)
LDL/HDL Ratio: 3.3 ratio — ABNORMAL HIGH (ref 0.0–3.2)
Triglycerides: 196 mg/dL — ABNORMAL HIGH (ref 0–149)
VLDL Cholesterol Cal: 34 mg/dL (ref 5–40)

## 2021-01-13 LAB — CMP14+EGFR
ALT: 51 IU/L — ABNORMAL HIGH (ref 0–32)
AST: 59 IU/L — ABNORMAL HIGH (ref 0–40)
Albumin/Globulin Ratio: 1.6 (ref 1.2–2.2)
Albumin: 4.1 g/dL (ref 3.8–4.8)
Alkaline Phosphatase: 44 IU/L (ref 44–121)
BUN/Creatinine Ratio: 13 (ref 12–28)
BUN: 10 mg/dL (ref 8–27)
Bilirubin Total: 0.4 mg/dL (ref 0.0–1.2)
CO2: 25 mmol/L (ref 20–29)
Calcium: 9.6 mg/dL (ref 8.7–10.3)
Chloride: 101 mmol/L (ref 96–106)
Creatinine, Ser: 0.76 mg/dL (ref 0.57–1.00)
Globulin, Total: 2.6 g/dL (ref 1.5–4.5)
Glucose: 116 mg/dL — ABNORMAL HIGH (ref 70–99)
Potassium: 3.8 mmol/L (ref 3.5–5.2)
Sodium: 141 mmol/L (ref 134–144)
Total Protein: 6.7 g/dL (ref 6.0–8.5)
eGFR: 86 mL/min/{1.73_m2} (ref >59)

## 2021-01-13 LAB — HEPATITIS C ANTIBODY: Hep C Virus Ab: <0.1 s/co ratio (ref 0.0–0.9)

## 2021-01-20 ENCOUNTER — Ambulatory Visit (INDEPENDENT_AMBULATORY_CARE_PROVIDER_SITE_OTHER): Payer: Medicare Other | Admitting: Nurse Practitioner

## 2021-01-20 ENCOUNTER — Encounter: Payer: Self-pay | Admitting: Nurse Practitioner

## 2021-01-20 ENCOUNTER — Other Ambulatory Visit: Payer: Self-pay

## 2021-01-20 ENCOUNTER — Encounter (INDEPENDENT_AMBULATORY_CARE_PROVIDER_SITE_OTHER): Payer: Self-pay

## 2021-01-20 VITALS — BP 126/76 | HR 55 | Resp 15 | Ht 67.0 in | Wt 231.0 lb

## 2021-01-20 DIAGNOSIS — R7989 Other specified abnormal findings of blood chemistry: Secondary | ICD-10-CM

## 2021-01-20 DIAGNOSIS — R7301 Impaired fasting glucose: Secondary | ICD-10-CM | POA: Insufficient documentation

## 2021-01-20 DIAGNOSIS — I10 Essential (primary) hypertension: Secondary | ICD-10-CM

## 2021-01-20 DIAGNOSIS — Z0001 Encounter for general adult medical examination with abnormal findings: Secondary | ICD-10-CM | POA: Diagnosis not present

## 2021-01-20 LAB — POCT GLYCOSYLATED HEMOGLOBIN (HGB A1C): Hemoglobin A1C: 5.1 % (ref 4.0–5.6)

## 2021-01-20 NOTE — Assessment & Plan Note (Signed)
-  reviewed labs

## 2021-01-20 NOTE — Assessment & Plan Note (Signed)
-  fasting glucose 116 -A1c by fingerstick today is 5.1%; labs were over a week old and couldn't be added

## 2021-01-20 NOTE — Progress Notes (Signed)
Established Patient Office Visit  Subjective:  Patient ID: Tonya Nelson, female    DOB: [birth year]  Age: 65 y.o. MRN: [id number]  CC:  Chief Complaint  Patient presents with   Annual Exam    HPI Tonya Nelson presents for physical exam with lab review. No acute concerns. Her labs had elevated LFTs and IFG.  Past Medical History:  Diagnosis Date   Asthma    Cellulitis    in toe on left foot   Endometrial polyp 12/2014   Essential hypertension    Pain in left upper arm 05/15/2019   PMB (postmenopausal bleeding) 11/18/2014   Porokeratosis    Superficial fungus infection of skin 06/04/2019   Thickened endometrium 05/23/2017   Will get endo bx    Past Surgical History:  Procedure Laterality Date   WISDOM TOOTH EXTRACTION      Family History  Problem Relation Age of Onset   Osteoporosis Mother    Stomach cancer Mother    Cancer Mother    Bone cancer Father    Cancer Father    Parkinson's disease Brother    Alzheimer's disease Brother    Diabetes Maternal Grandmother    Glaucoma Maternal Grandfather     Social History   Socioeconomic History   Marital status: Widowed    Spouse name: Not on file   Number of children: 1   Years of education: Not on file   Highest education level: Some college, no degree  Occupational History   Occupation: Retired  Tobacco Use   Smoking status: Never   Smokeless tobacco: Never  Vaping Use   Vaping Use: Never used  Substance and Sexual Activity   Alcohol use: No   Drug use: No   Sexual activity: Not Currently    Birth control/protection: Post-menopausal  Other Topics Concern   Not on file  Social History Narrative   Lives with Son      Enjoys: music-concert, reading, being outside      Diet: eats all food groups   Caffeine: half and half tea-decaff usually   Water: 6-8 cups daily      Wears seat belt   Does not use phone while driving    Smoke detectors at home   Right handed    Social Determinants of Health    Financial Resource Strain: Low Risk    Difficulty of Paying Living Expenses: Not hard at all  Food Insecurity: No Food Insecurity   Worried About Charity fundraiser in the Last Year: Never true   [place] in the Last Year: Never true  Transportation Needs: No Transportation Needs   Lack of Transportation (Medical): No   Lack of Transportation (Non-Medical): No  Physical Activity: Insufficiently Active   Days of Exercise per Week: 2 days   Minutes of Exercise per Session: 30 min  Stress: No Stress Concern Present   Feeling of Stress : Not at all  Social Connections: Moderately Isolated   Frequency of Communication with Friends and Family: Three times a week   Frequency of Social Gatherings with Friends and Family: Twice a week   Attends Religious Services: More than 4 times per year   Active Member of [employer] or Organizations: No   Attends Archivist Meetings: Never   Marital Status: Widowed  Intimate Partner Violence: Not At Risk   Fear of Current or Ex-Partner: No   Emotionally Abused: No   Physically Abused: No   Sexually Abused:  No    Outpatient Medications Prior to Visit  Medication Sig Dispense Refill   albuterol (VENTOLIN HFA) 108 (90 Base) MCG/ACT inhaler Inhale 2 puffs into the lungs every 6 (six) hours as needed for wheezing or shortness of breath. 8 g 0   ammonium lactate (AMLACTIN) 12 % cream Apply topically 2 (two) times daily as needed. 140 g 1   bisoprolol-hydrochlorothiazide (ZIAC) 5-6.25 MG tablet Take 1 tablet by mouth daily. 90 tablet 1   Blood Pressure Monitoring (10 SERIES BP MONITOR/UPPER ARM) DEVI 1 each by Does not apply route daily. Monitor blood pressure daily. 1 each 0   cholecalciferol (VITAMIN D) 1000 units tablet Take 2,000 Units by mouth daily.     diclofenac (VOLTAREN) 25 MG EC tablet Take 1 tablet (25 mg total) by mouth 2 (two) times daily. 30 tablet 0   furosemide (LASIX) 40 MG tablet Take 1 tablet (40 mg total) by mouth daily.  30 tablet 1   nystatin (NYSTATIN) powder APPLY TO THE AFFECTED AREA THREE TIMES DAILY 30 g 0   benzonatate (TESSALON) 100 MG capsule Take 1 capsule (100 mg total) by mouth 2 (two) times daily as needed for cough. 30 capsule 0   No facility-administered medications prior to visit.    Allergies  Allergen Reactions   Penicillins Rash    ROS Review of Systems  Constitutional: Negative.   HENT: Negative.    Eyes: Negative.   Respiratory: Negative.    Cardiovascular: Negative.   Gastrointestinal: Negative.   Endocrine: Negative.   Genitourinary: Negative.   Musculoskeletal: Negative.   Skin: Negative.   Allergic/Immunologic: Negative.   Neurological: Negative.   Hematological: Negative.   Psychiatric/Behavioral: Negative.       Objective:    Physical Exam Constitutional:      Appearance: Normal appearance. She is obese.  HENT:     Head: Normocephalic and atraumatic.     Right Ear: Tympanic membrane, ear canal and external ear normal.     Left Ear: Tympanic membrane, ear canal and external ear normal.     Nose: Nose normal.     Mouth/Throat:     Mouth: Mucous membranes are dry.     Pharynx: Oropharynx is clear.  Eyes:     Extraocular Movements: Extraocular movements intact.     Conjunctiva/sclera: Conjunctivae normal.     Pupils: Pupils are equal, round, and reactive to light.  Cardiovascular:     Rate and Rhythm: Normal rate and regular rhythm.     Pulses: Normal pulses.     Heart sounds: Normal heart sounds.  Pulmonary:     Effort: Pulmonary effort is normal.     Breath sounds: Normal breath sounds.  Abdominal:     General: Abdomen is flat. Bowel sounds are normal.     Palpations: Abdomen is soft.  Musculoskeletal:        General: Swelling present. Normal range of motion.     Cervical back: Normal range of motion and neck supple.     Comments: Bilateral leg swelling; non-pitting  Skin:    General: Skin is warm and dry.     Capillary Refill: Capillary refill  takes less than 2 seconds.  Neurological:     General: No focal deficit present.     Mental Status: She is alert and oriented to person, place, and time.     Cranial Nerves: No cranial nerve deficit.     Sensory: No sensory deficit.     Motor: No weakness.  Coordination: Coordination normal.     Gait: Gait normal.  Psychiatric:        Mood and Affect: Mood normal.        Behavior: Behavior normal.        Thought Content: Thought content normal.        Judgment: Judgment normal.    BP 126/76   Pulse (!) 55   Resp 15   Ht '5\' 7"'  (1.702 m)   Wt 231 lb (104.8 kg)   SpO2 94%   BMI 36.18 kg/m  Wt Readings from Last 3 Encounters:  01/20/21 231 lb (104.8 kg)  07/30/20 232 lb 12.8 oz (105.6 kg)  07/15/20 234 lb (106.1 kg)     Health Maintenance Due  Topic Date Due   TETANUS/TDAP  Never done   COLONOSCOPY (Pts 45-71yr Insurance coverage will need to be confirmed)  Never done   Zoster Vaccines- Shingrix (1 of 2) Never done   Pneumonia Vaccine 66 Years old (1 - PCV) Never done    There are no preventive care reminders to display for this patient.  Lab Results  Component Value Date   TSH 1.41 04/23/2019   Lab Results  Component Value Date   WBC 6.0 01/12/2021   HGB 13.5 01/12/2021   HCT 39.4 01/12/2021   MCV 85 01/12/2021   PLT 209 01/12/2021   Lab Results  Component Value Date   NA 141 01/12/2021   K 3.8 01/12/2021   CO2 25 01/12/2021   GLUCOSE 116 (H) 01/12/2021   BUN 10 01/12/2021   CREATININE 0.76 01/12/2021   BILITOT 0.4 01/12/2021   ALKPHOS 44 01/12/2021   AST 59 (H) 01/12/2021   ALT 51 (H) 01/12/2021   PROT 6.7 01/12/2021   ALBUMIN 4.1 01/12/2021   CALCIUM 9.6 01/12/2021   EGFR 86 01/12/2021   Lab Results  Component Value Date   CHOL 164 01/12/2021   Lab Results  Component Value Date   HDL 30 (L) 01/12/2021   Lab Results  Component Value Date   LDLCALC 100 (H) 01/12/2021   Lab Results  Component Value Date   TRIG 196 (H) 01/12/2021    Lab Results  Component Value Date   CHOLHDL 4.7 (H) 01/14/2020   Lab Results  Component Value Date   HGBA1C 5.4 01/14/2020      Assessment & Plan:   Problem List Items Addressed This Visit       Cardiovascular and Mediastinum   Essential hypertension    BP Readings from Last 3 Encounters:  01/20/21 126/76  07/30/20 (!) 159/85  07/15/20 126/75  -BP looks great today        Endocrine   IFG (impaired fasting glucose)    -fasting glucose 116 -A1c by fingerstick today is 5.1%; labs were over a week old and couldn't be added         Other   Encounter for general adult medical examination with abnormal findings - Primary    -reviewed labs      LFTs abnormal    Lab Results  Component Value Date   ALT 51 (H) 01/12/2021   AST 59 (H) 01/12/2021   ALKPHOS 44 01/12/2021   BILITOT 0.4 01/12/2021  -ALT and AST elevated, but she states she was having GI illness when labs were drawn -recheck in 1 month      Relevant Orders   CBC with Differential/Platelet    No orders of the defined types were placed in this encounter.  Follow-up: Return in about 1 month (around 02/20/2021) for Check LFTs.    Noreene Larsson, NP

## 2021-01-20 NOTE — Patient Instructions (Signed)
Please have labs drawn in 1 month to recheck liver function tests. No need for fasting.  I will be moving to [place] located at [address], [place], [place] [address] effective Feb 14, 2021. If you would like to establish care with [facility] please call [phone], otherwise the great team at [facility] will continue to take excellent care of you.

## 2021-01-20 NOTE — Progress Notes (Signed)
Looks great!

## 2021-01-20 NOTE — Assessment & Plan Note (Signed)
BP Readings from Last 3 Encounters:  01/20/21 126/76  07/30/20 (!) 159/85  07/15/20 126/75   -BP looks great today

## 2021-01-20 NOTE — Progress Notes (Signed)
Trigs and LFTs elevated, but I will discuss this with her today.

## 2021-01-20 NOTE — Addendum Note (Signed)
Addended by: Eual Fines on: 01/20/2021 09:58 AM   Modules accepted: Orders

## 2021-01-20 NOTE — Assessment & Plan Note (Signed)
Lab Results  Component Value Date   ALT 51 (H) 01/12/2021   AST 59 (H) 01/12/2021   ALKPHOS 44 01/12/2021   BILITOT 0.4 01/12/2021   -ALT and AST elevated, but she states she was having GI illness when labs were drawn -recheck in 1 month

## 2021-02-01 ENCOUNTER — Other Ambulatory Visit: Payer: Self-pay

## 2021-02-01 ENCOUNTER — Encounter: Payer: Self-pay | Admitting: Nurse Practitioner

## 2021-02-01 ENCOUNTER — Ambulatory Visit: Payer: Medicare Other

## 2021-02-01 ENCOUNTER — Ambulatory Visit (INDEPENDENT_AMBULATORY_CARE_PROVIDER_SITE_OTHER): Payer: Medicare Other | Admitting: Nurse Practitioner

## 2021-02-01 DIAGNOSIS — J069 Acute upper respiratory infection, unspecified: Secondary | ICD-10-CM | POA: Diagnosis not present

## 2021-02-01 LAB — POCT INFLUENZA A/B
Influenza A, POC: NEGATIVE
Influenza B, POC: NEGATIVE

## 2021-02-01 MED ORDER — CORICIDIN HBP COUGH/COLD 4-30 MG PO TABS: 1.0000 | ORAL_TABLET | Freq: Four times a day (QID) | ORAL | 0 refills | Status: DC | PRN

## 2021-02-01 NOTE — Progress Notes (Signed)
Acute Office Visit  Subjective:    Patient ID: Tonya Nelson, female    DOB: [birth year], 65 y.o.   MRN: [id number]  Chief Complaint  Patient presents with   Sinusitis   Cough    Cough congestion x 2 days     Sinusitis Associated symptoms include congestion, coughing and sneezing. Pertinent negatives include no chills, shortness of breath, sinus pressure or sore throat.  Cough Pertinent negatives include no chills, fever, rhinorrhea, sore throat, shortness of breath or wheezing.  Patient is in today for sick visit. Symptoms started 2 days ago. She hasn't tried any OTC meds.   Past Medical History:  Diagnosis Date   Asthma    Cellulitis    in toe on left foot   Endometrial polyp 12/2014   Essential hypertension    Pain in left upper arm 05/15/2019   PMB (postmenopausal bleeding) 11/18/2014   Porokeratosis    Superficial fungus infection of skin 06/04/2019   Thickened endometrium 05/23/2017   Will get endo bx    Past Surgical History:  Procedure Laterality Date   WISDOM TOOTH EXTRACTION      Family History  Problem Relation Age of Onset   Osteoporosis Mother    Stomach cancer Mother    Cancer Mother    Bone cancer Father    Cancer Father    Parkinson's disease Brother    Alzheimer's disease Brother    Diabetes Maternal Grandmother    Glaucoma Maternal Grandfather     Social History   Socioeconomic History   Marital status: Widowed    Spouse name: Not on file   Number of children: 1   Years of education: Not on file   Highest education level: Some college, no degree  Occupational History   Occupation: Retired  Tobacco Use   Smoking status: Never   Smokeless tobacco: Never  Vaping Use   Vaping Use: Never used  Substance and Sexual Activity   Alcohol use: No   Drug use: No   Sexual activity: Not Currently    Birth control/protection: Post-menopausal  Other Topics Concern   Not on file  Social History Narrative   Lives with Son      Enjoys:  music-concert, reading, being outside      Diet: eats all food groups   Caffeine: half and half tea-decaff usually   Water: 6-8 cups daily      Wears seat belt   Does not use phone while driving    Smoke detectors at home   Right handed    Social Determinants of Health   Financial Resource Strain: Low Risk    Difficulty of Paying Living Expenses: Not hard at all  Food Insecurity: No Food Insecurity   Worried About Charity fundraiser in the Last Year: Never true   [place] in the Last Year: Never true  Transportation Needs: No Transportation Needs   Lack of Transportation (Medical): No   Lack of Transportation (Non-Medical): No  Physical Activity: Insufficiently Active   Days of Exercise per Week: 2 days   Minutes of Exercise per Session: 30 min  Stress: No Stress Concern Present   Feeling of Stress : Not at all  Social Connections: Moderately Isolated   Frequency of Communication with Friends and Family: Three times a week   Frequency of Social Gatherings with Friends and Family: Twice a week   Attends Religious Services: More than 4 times per year   Active Member of  Clubs or Organizations: No   Attends Archivist Meetings: Never   Marital Status: Widowed  Human resources officer Violence: Not At Risk   Fear of Current or Ex-Partner: No   Emotionally Abused: No   Physically Abused: No   Sexually Abused: No    Outpatient Medications Prior to Visit  Medication Sig Dispense Refill   albuterol (VENTOLIN HFA) 108 (90 Base) MCG/ACT inhaler Inhale 2 puffs into the lungs every 6 (six) hours as needed for wheezing or shortness of breath. 8 g 0   ammonium lactate (AMLACTIN) 12 % cream Apply topically 2 (two) times daily as needed. 140 g 1   bisoprolol-hydrochlorothiazide (ZIAC) 5-6.25 MG tablet Take 1 tablet by mouth daily. 90 tablet 1   Blood Pressure Monitoring (10 SERIES BP MONITOR/UPPER ARM) DEVI 1 each by Does not apply route daily. Monitor blood pressure daily. 1  each 0   cholecalciferol (VITAMIN D) 1000 units tablet Take 2,000 Units by mouth daily.     diclofenac (VOLTAREN) 25 MG EC tablet Take 1 tablet (25 mg total) by mouth 2 (two) times daily. 30 tablet 0   furosemide (LASIX) 40 MG tablet Take 1 tablet (40 mg total) by mouth daily. 30 tablet 1   nystatin (NYSTATIN) powder APPLY TO THE AFFECTED AREA THREE TIMES DAILY 30 g 0   No facility-administered medications prior to visit.    Allergies  Allergen Reactions   Penicillins Rash    Review of Systems  Constitutional:  Negative for chills, fatigue and fever.  HENT:  Positive for congestion and sneezing. Negative for rhinorrhea, sinus pressure, sinus pain and sore throat.   Respiratory:  Positive for cough. Negative for shortness of breath and wheezing.       Objective:    Physical Exam  There were no vitals taken for this visit. Wt Readings from Last 3 Encounters:  01/20/21 231 lb (104.8 kg)  07/30/20 232 lb 12.8 oz (105.6 kg)  07/15/20 234 lb (106.1 kg)    Health Maintenance Due  Topic Date Due   TETANUS/TDAP  Never done   COLONOSCOPY (Pts 45-37yr Insurance coverage will need to be confirmed)  Never done   Zoster Vaccines- Shingrix (1 of 2) Never done   Pneumonia Vaccine 65 Years old (1 - PCV) Never done    There are no preventive care reminders to display for this patient.   Lab Results  Component Value Date   TSH 1.41 04/23/2019   Lab Results  Component Value Date   WBC 6.0 01/12/2021   HGB 13.5 01/12/2021   HCT 39.4 01/12/2021   MCV 85 01/12/2021   PLT 209 01/12/2021   Lab Results  Component Value Date   NA 141 01/12/2021   K 3.8 01/12/2021   CO2 25 01/12/2021   GLUCOSE 116 (H) 01/12/2021   BUN 10 01/12/2021   CREATININE 0.76 01/12/2021   BILITOT 0.4 01/12/2021   ALKPHOS 44 01/12/2021   AST 59 (H) 01/12/2021   ALT 51 (H) 01/12/2021   PROT 6.7 01/12/2021   ALBUMIN 4.1 01/12/2021   CALCIUM 9.6 01/12/2021   EGFR 86 01/12/2021   Lab Results  Component  Value Date   CHOL 164 01/12/2021   Lab Results  Component Value Date   HDL 30 (L) 01/12/2021   Lab Results  Component Value Date   LDLCALC 100 (H) 01/12/2021   Lab Results  Component Value Date   TRIG 196 (H) 01/12/2021   Lab Results  Component Value Date  CHOLHDL 4.7 (H) 01/14/2020   Lab Results  Component Value Date   HGBA1C 5.1 01/20/2021       Assessment & Plan:   Problem List Items Addressed This Visit       Respiratory   URI (upper respiratory infection) - Primary    -cough and nasal congestion started 2 days ago -Rx. Coricidin- HBP since she has hx of HTN -wil lget flu and COVID swab -if those are negative, will consider abx if symptoms persist through Friday       Relevant Medications   Chlorpheniramine-DM (CORICIDIN COUGH/COLD) 4-30 MG TABS   Other Relevant Orders   Novel Coronavirus, NAA ([facility])     Meds ordered this encounter  Medications   Chlorpheniramine-DM (CORICIDIN COUGH/COLD) 4-30 MG TABS    Sig: Take 1 tablet by mouth every 6 (six) hours as needed.    Dispense:  28 tablet    Refill:  0   Date:  02/01/2021   Location of Patient: Home Location of Provider: Office Consent was obtain for visit to be over via telehealth. I verified that I am speaking with the correct person using two identifiers.  I connected with  KALKIDAN CAUDELL on 02/01/21 via telephone and verified that I am speaking with the correct person using two identifiers.   I discussed the limitations of evaluation and management by telemedicine. The patient expressed understanding and agreed to proceed.  Time spent: 6 minutes     Noreene Larsson, NP

## 2021-02-01 NOTE — Progress Notes (Signed)
Flu is negative. COVID will result in 1-2 days.

## 2021-02-01 NOTE — Addendum Note (Signed)
Addended by: Quentin Angst on: 02/01/2021 10:35 AM   Modules accepted: Orders

## 2021-02-01 NOTE — Assessment & Plan Note (Signed)
-  cough and nasal congestion started 2 days ago -Rx. Coricidin- HBP since she has hx of HTN -wil lget flu and COVID swab -if those are negative, will consider abx if symptoms persist through Friday

## 2021-02-03 ENCOUNTER — Telehealth: Payer: Self-pay

## 2021-02-03 ENCOUNTER — Telehealth: Payer: Self-pay | Admitting: Nurse Practitioner

## 2021-02-03 DIAGNOSIS — M79675 Pain in left toe(s): Secondary | ICD-10-CM | POA: Diagnosis not present

## 2021-02-03 DIAGNOSIS — M79672 Pain in left foot: Secondary | ICD-10-CM | POA: Diagnosis not present

## 2021-02-03 DIAGNOSIS — I739 Peripheral vascular disease, unspecified: Secondary | ICD-10-CM | POA: Diagnosis not present

## 2021-02-03 DIAGNOSIS — M79674 Pain in right toe(s): Secondary | ICD-10-CM | POA: Diagnosis not present

## 2021-02-03 DIAGNOSIS — L110 Acquired keratosis follicularis: Secondary | ICD-10-CM | POA: Diagnosis not present

## 2021-02-03 DIAGNOSIS — M79671 Pain in right foot: Secondary | ICD-10-CM | POA: Diagnosis not present

## 2021-02-03 LAB — SARS-COV-2, NAA 2 DAY TAT

## 2021-02-03 LAB — NOVEL CORONAVIRUS, NAA: SARS-CoV-2, NAA: NOT DETECTED

## 2021-02-03 NOTE — Progress Notes (Signed)
Flu and COVID are negative!

## 2021-02-03 NOTE — Telephone Encounter (Signed)
Patient aware.

## 2021-02-03 NOTE — Telephone Encounter (Signed)
Patient returning call to nurse about lab results

## 2021-02-03 NOTE — Progress Notes (Signed)
The note says "if those are negative, will consider abx if symptoms persist through Friday", so if she is having symptoms Thursday evening or Friday AM, I'll send in an antibiotic for her.

## 2021-02-03 NOTE — Telephone Encounter (Signed)
Pt returning call for lab results  

## 2021-02-05 ENCOUNTER — Telehealth: Payer: Self-pay

## 2021-02-05 ENCOUNTER — Other Ambulatory Visit: Payer: Self-pay | Admitting: Nurse Practitioner

## 2021-02-05 DIAGNOSIS — J069 Acute upper respiratory infection, unspecified: Secondary | ICD-10-CM

## 2021-02-05 MED ORDER — AZITHROMYCIN 250 MG PO TABS: ORAL_TABLET | ORAL | 0 refills | Status: DC

## 2021-02-05 NOTE — Telephone Encounter (Signed)
Patient aware.

## 2021-02-05 NOTE — Telephone Encounter (Signed)
Patient called left voice mail that should would like for Pearline Cables to send something into her pharmacy [facility] on [address].  Still coughing and congested.

## 2021-02-05 NOTE — Telephone Encounter (Signed)
Sent a z-pack to [facility] [address]

## 2021-05-25 ENCOUNTER — Ambulatory Visit: Payer: Medicare Other | Admitting: Nurse Practitioner

## 2021-11-08 ENCOUNTER — Ambulatory Visit: Payer: Medicare Other | Admitting: Internal Medicine

## 2021-11-18 ENCOUNTER — Encounter: Payer: Self-pay | Admitting: Internal Medicine

## 2021-11-18 ENCOUNTER — Ambulatory Visit (INDEPENDENT_AMBULATORY_CARE_PROVIDER_SITE_OTHER): Payer: Medicare Other | Admitting: Internal Medicine

## 2021-11-18 VITALS — BP 135/75 | HR 63 | Ht 67.5 in | Wt 224.2 lb

## 2021-11-18 DIAGNOSIS — Z1329 Encounter for screening for other suspected endocrine disorder: Secondary | ICD-10-CM | POA: Diagnosis not present

## 2021-11-18 DIAGNOSIS — Z7689 Persons encountering health services in other specified circumstances: Secondary | ICD-10-CM

## 2021-11-18 DIAGNOSIS — M810 Age-related osteoporosis without current pathological fracture: Secondary | ICD-10-CM | POA: Diagnosis not present

## 2021-11-18 DIAGNOSIS — Z1231 Encounter for screening mammogram for malignant neoplasm of breast: Secondary | ICD-10-CM | POA: Diagnosis not present

## 2021-11-18 DIAGNOSIS — E7800 Pure hypercholesterolemia, unspecified: Secondary | ICD-10-CM | POA: Diagnosis not present

## 2021-11-18 DIAGNOSIS — Z1211 Encounter for screening for malignant neoplasm of colon: Secondary | ICD-10-CM

## 2021-11-18 DIAGNOSIS — R7303 Prediabetes: Secondary | ICD-10-CM | POA: Diagnosis not present

## 2021-11-18 DIAGNOSIS — R7989 Other specified abnormal findings of blood chemistry: Secondary | ICD-10-CM

## 2021-11-18 DIAGNOSIS — Z2821 Immunization not carried out because of patient refusal: Secondary | ICD-10-CM | POA: Diagnosis not present

## 2021-11-18 DIAGNOSIS — J45909 Unspecified asthma, uncomplicated: Secondary | ICD-10-CM

## 2021-11-18 DIAGNOSIS — H6123 Impacted cerumen, bilateral: Secondary | ICD-10-CM

## 2021-11-18 DIAGNOSIS — R7301 Impaired fasting glucose: Secondary | ICD-10-CM

## 2021-11-18 DIAGNOSIS — I10 Essential (primary) hypertension: Secondary | ICD-10-CM | POA: Diagnosis not present

## 2021-11-18 NOTE — Progress Notes (Signed)
New Patient Office Visit  Subjective    Patient ID: Tonya Nelson, female    DOB: 17-Oct-1955  Age: 66 y.o. MRN: [id number]  CC:  Chief Complaint  Patient presents with   Establish Care    HPI Tonya Nelson presents to re-establish care.  She is a 66 year old woman with a past medical history significant for HTN, osteoporosis, asthma, and prediabetes.  She was previously followed by Tonya Romberg, NP at [facility] who is now with [facility].  Today Tonya Nelson states that she feels well.  She endorses feeling as though her ears are "stopped up" but is otherwise asymptomatic.  She would like to reestablish care at [facility] today.  She has no additional concerns.  Chronic medical conditions and outstanding preventative healthcare maintenance items discussed today are individually addressed in A/P below.  Outpatient Encounter Medications as of 11/18/2021  Medication Sig   albuterol (VENTOLIN HFA) 108 (90 Base) MCG/ACT inhaler Inhale 2 puffs into the lungs every 6 (six) hours as needed for wheezing or shortness of breath.   ammonium lactate (AMLACTIN) 12 % cream Apply topically 2 (two) times daily as needed.   bisoprolol-hydrochlorothiazide (ZIAC) 5-6.25 MG tablet Take 1 tablet by mouth daily.   cholecalciferol (VITAMIN D) 1000 units tablet Take 2,000 Units by mouth daily.   diclofenac (VOLTAREN) 25 MG EC tablet Take 1 tablet (25 mg total) by mouth 2 (two) times daily.   nystatin (NYSTATIN) powder APPLY TO THE AFFECTED AREA THREE TIMES DAILY   [DISCONTINUED] Blood Pressure Monitoring (10 SERIES BP MONITOR/UPPER ARM) DEVI 1 each by Does not apply route daily. Monitor blood pressure daily.   [DISCONTINUED] furosemide (LASIX) 40 MG tablet Take 1 tablet (40 mg total) by mouth daily.   [DISCONTINUED] azithromycin (ZITHROMAX) 250 MG tablet Please dispense as a z-pack   [DISCONTINUED] Chlorpheniramine-DM (CORICIDIN COUGH/COLD) 4-30 MG TABS Take 1 tablet by mouth every 6 (six) hours as needed.   No  facility-administered encounter medications on file as of 11/18/2021.    Past Medical History:  Diagnosis Date   Asthma    Cellulitis    in toe on left foot   Endometrial polyp 12/2014   Essential hypertension    Pain in left upper arm 05/15/2019   PMB (postmenopausal bleeding) 11/18/2014   Porokeratosis    Superficial fungus infection of skin 06/04/2019   Thickened endometrium 05/23/2017   Will get endo bx    Past Surgical History:  Procedure Laterality Date   WISDOM TOOTH EXTRACTION      Family History  Problem Relation Age of Onset   Osteoporosis Mother    Stomach cancer Mother    Cancer Mother    Bone cancer Father    Cancer Father    Parkinson's disease Brother    Alzheimer's disease Brother    Diabetes Maternal Grandmother    Glaucoma Maternal Grandfather     Social History   Socioeconomic History   Marital status: Widowed    Spouse name: Not on file   Number of children: 1   Years of education: Not on file   Highest education level: Some college, no degree  Occupational History   Occupation: Retired  Tobacco Use   Smoking status: Never   Smokeless tobacco: Never  Vaping Use   Vaping Use: Never used  Substance and Sexual Activity   Alcohol use: No   Drug use: No   Sexual activity: Not Currently    Birth control/protection: Post-menopausal  Other Topics Concern  Not on file  Social History Narrative   Lives with Son      Enjoys: music-concert, reading, being outside      Diet: eats all food groups   Caffeine: half and half tea-decaff usually   Water: 6-8 cups daily      Wears seat belt   Does not use phone while driving    Smoke detectors at home   Right handed    Social Determinants of Health   Financial Resource Strain: Low Risk  (07/15/2020)   Overall Financial Resource Strain (CARDIA)    Difficulty of Paying Living Expenses: Not hard at all  Food Insecurity: No Food Insecurity (07/15/2020)   Hunger Vital Sign    Worried About Running Out  of Food in the Last Year: Never true    [place] in the Last Year: Never true  Transportation Needs: No Transportation Needs (07/15/2020)   PRAPARE - Hydrologist (Medical): No    Lack of Transportation (Non-Medical): No  Physical Activity: Insufficiently Active (07/15/2020)   Exercise Vital Sign    Days of Exercise per Week: 2 days    Minutes of Exercise per Session: 30 min  Stress: No Stress Concern Present (07/15/2020)   [place]    Feeling of Stress : Not at all  Social Connections: Moderately Isolated (07/15/2020)   Social Connection and Isolation Panel [NHANES]    Frequency of Communication with Friends and Family: Three times a week    Frequency of Social Gatherings with Friends and Family: Twice a week    Attends Religious Services: More than 4 times per year    Active Member of [employer] or Organizations: No    Attends Archivist Meetings: Never    Marital Status: Widowed  Intimate Partner Violence: Not At Risk (07/15/2020)   Humiliation, Afraid, Rape, and Kick questionnaire    Fear of Current or Ex-Partner: No    Emotionally Abused: No    Physically Abused: No    Sexually Abused: No    Review of Systems  Constitutional:  Negative for chills and fever.  HENT:  Negative for sore throat.        Ears stopped up  Respiratory:  Negative for cough and shortness of breath.   Cardiovascular:  Negative for chest pain, palpitations and leg swelling.  Gastrointestinal:  Negative for abdominal pain, blood in stool, constipation, diarrhea, nausea and vomiting.  Genitourinary:  Negative for dysuria and hematuria.  Musculoskeletal:  Negative for myalgias.  Skin:  Negative for itching and rash.  Neurological:  Negative for dizziness and headaches.  Psychiatric/Behavioral:  Negative for depression and suicidal ideas.         Objective    BP 135/75   Pulse 63   Ht 5' 7.5" (1.715  m)   Wt 224 lb 3.2 oz (101.7 kg)   SpO2 96%   BMI 34.60 kg/m   Physical Exam Vitals reviewed.  Constitutional:      General: She is not in acute distress.    Appearance: Normal appearance. She is not toxic-appearing.  HENT:     Head: Normocephalic and atraumatic.     Right Ear: There is impacted cerumen.     Left Ear: There is impacted cerumen.     Nose: No congestion or rhinorrhea.     Mouth/Throat:     Mouth: Mucous membranes are moist.     Pharynx: Oropharynx is clear.  No oropharyngeal exudate or posterior oropharyngeal erythema.  Eyes:     General: No scleral icterus.    Extraocular Movements: Extraocular movements intact.     Conjunctiva/sclera: Conjunctivae normal.     Pupils: Pupils are equal, round, and reactive to light.  Cardiovascular:     Rate and Rhythm: Normal rate and regular rhythm.     Pulses: Normal pulses.     Heart sounds: Normal heart sounds. No murmur heard.    No friction rub. No gallop.  Pulmonary:     Effort: Pulmonary effort is normal.     Breath sounds: Normal breath sounds. No wheezing, rhonchi or rales.  Abdominal:     General: Abdomen is flat. Bowel sounds are normal. There is no distension.     Palpations: Abdomen is soft.     Tenderness: There is no abdominal tenderness.  Musculoskeletal:        General: Normal range of motion.     Cervical back: Normal range of motion and neck supple.     Right lower leg: No edema.     Left lower leg: No edema.  Lymphadenopathy:     Cervical: No cervical adenopathy.  Skin:    General: Skin is warm.     Capillary Refill: Capillary refill takes less than 2 seconds.     Coloration: Skin is not jaundiced.  Neurological:     General: No focal deficit present.     Mental Status: She is alert and oriented to person, place, and time.     Motor: No weakness.  Psychiatric:        Mood and Affect: Mood normal.        Behavior: Behavior normal.    Last CBC Lab Results  Component Value Date   WBC 8.5  11/18/2021   HGB 14.0 11/18/2021   HCT 42.4 11/18/2021   MCV 87 11/18/2021   MCH 28.7 11/18/2021   RDW 13.1 11/18/2021   PLT 246 88/50/2774   Last metabolic panel Lab Results  Component Value Date   GLUCOSE 104 (H) 11/18/2021   NA 141 11/18/2021   K 3.7 11/18/2021   CL 101 11/18/2021   CO2 24 11/18/2021   BUN 11 11/18/2021   CREATININE 0.74 11/18/2021   EGFR 89 11/18/2021   CALCIUM 9.6 11/18/2021   PROT 7.3 11/18/2021   ALBUMIN 4.4 11/18/2021   LABGLOB 2.9 11/18/2021   AGRATIO 1.5 11/18/2021   BILITOT 0.3 11/18/2021   ALKPHOS 44 11/18/2021   AST 35 11/18/2021   ALT 35 (H) 11/18/2021   Last lipids Lab Results  Component Value Date   CHOL 173 11/18/2021   HDL 34 (L) 11/18/2021   LDLCALC 93 11/18/2021   TRIG 274 (H) 11/18/2021   CHOLHDL 5.1 (H) 11/18/2021   Last hemoglobin A1c Lab Results  Component Value Date   HGBA1C 5.1 01/20/2021   Last thyroid functions Lab Results  Component Value Date   TSH 1.160 11/18/2021   Last vitamin D Lab Results  Component Value Date   VD25OH 23.0 (L) 11/18/2021    Assessment & Plan:   Problem List Items Addressed This Visit     Essential hypertension    BP 135/75 initially, improved to 125/80 on repeat.  She is currently prescribed bisoprolol-HCTZ 5-6.25 mg daily. -No changes today      Asthma    Asymptomatic currently.  Unremarkable pulmonary exam.  She has albuterol inhaler for as needed use but has not needed to use inhaler recently  Bilateral impacted cerumen    Bilateral impacted cerumen noted on exam today.  She reports that her ears feel stopped up.  Both ears successfully irrigated.      Osteoporosis    She is currently on vitamin D supplementation, 2000 units daily.  Repeat vitamin D level today.        Prediabetes    HbA1c 5.7 on most recent labs.  We discussed lifestyle modifications including limiting fatty/fried foods and incorporating more fruits, nuts, vegetables, and lean proteins into her  diet.  We also discussed the national recommendation for at least 150 total minutes of moderate density exercise on a weekly basis.      Encounter to establish care    She returns to reestablish care today.  Recent medical records and lab work reviewed. -Outstanding vaccines declined today. -Cologuard ordered for colorectal cancer screening. -Mammogram ordered today -Basic lab work ordered.      Return in about 6 months (around 05/20/2022).   Johnette Abraham, MD

## 2021-11-18 NOTE — Patient Instructions (Signed)
It was a pleasure to see you today.  Thank you for giving [place] the opportunity to be involved in your care.  Below is a brief recap of your visit and next steps.  We will plan to see you again in 6 months.  Summary You have re-established care at our practice today I have ordered basic labs today as well as cologuard for colon cancer screen Referral placed for mammogram  Next steps Follow up in 6 months I will notify you of lab results

## 2021-11-19 LAB — CMP14+EGFR
ALT: 35 IU/L — ABNORMAL HIGH (ref 0–32)
AST: 35 IU/L (ref 0–40)
Albumin/Globulin Ratio: 1.5 (ref 1.2–2.2)
Albumin: 4.4 g/dL (ref 3.9–4.9)
Alkaline Phosphatase: 44 IU/L (ref 44–121)
BUN/Creatinine Ratio: 15 (ref 12–28)
BUN: 11 mg/dL (ref 8–27)
Bilirubin Total: 0.3 mg/dL (ref 0.0–1.2)
CO2: 24 mmol/L (ref 20–29)
Calcium: 9.6 mg/dL (ref 8.7–10.3)
Chloride: 101 mmol/L (ref 96–106)
Creatinine, Ser: 0.74 mg/dL (ref 0.57–1.00)
Globulin, Total: 2.9 g/dL (ref 1.5–4.5)
Glucose: 104 mg/dL — ABNORMAL HIGH (ref 70–99)
Potassium: 3.7 mmol/L (ref 3.5–5.2)
Sodium: 141 mmol/L (ref 134–144)
Total Protein: 7.3 g/dL (ref 6.0–8.5)
eGFR: 89 mL/min/{1.73_m2} (ref >59)

## 2021-11-19 LAB — LIPID PANEL
Chol/HDL Ratio: 5.1 ratio — ABNORMAL HIGH (ref 0.0–4.4)
Cholesterol, Total: 173 mg/dL (ref 100–199)
HDL: 34 mg/dL — ABNORMAL LOW (ref >39)
LDL Chol Calc (NIH): 93 mg/dL (ref 0–99)
Triglycerides: 274 mg/dL — ABNORMAL HIGH (ref 0–149)
VLDL Cholesterol Cal: 46 mg/dL — ABNORMAL HIGH (ref 5–40)

## 2021-11-19 LAB — CBC WITH DIFFERENTIAL/PLATELET
Basophils Absolute: 0 10*3/uL (ref 0.0–0.2)
Basos: 1 %
EOS (ABSOLUTE): 0.4 10*3/uL (ref 0.0–0.4)
Eos: 4 %
Hematocrit: 42.4 % (ref 34.0–46.6)
Hemoglobin: 14 g/dL (ref 11.1–15.9)
Immature Grans (Abs): 0 10*3/uL (ref 0.0–0.1)
Immature Granulocytes: 0 %
Lymphocytes Absolute: 3.3 10*3/uL — ABNORMAL HIGH (ref 0.7–3.1)
Lymphs: 38 %
MCH: 28.7 pg (ref 26.6–33.0)
MCHC: 33 g/dL (ref 31.5–35.7)
MCV: 87 fL (ref 79–97)
Monocytes Absolute: 0.6 10*3/uL (ref 0.1–0.9)
Monocytes: 7 %
Neutrophils Absolute: 4.3 10*3/uL (ref 1.4–7.0)
Neutrophils: 50 %
Platelets: 246 10*3/uL (ref 150–450)
RBC: 4.87 x10E6/uL (ref 3.77–5.28)
RDW: 13.1 % (ref 11.7–15.4)
WBC: 8.5 10*3/uL (ref 3.4–10.8)

## 2021-11-19 LAB — TSH+FREE T4
Free T4: 1.13 ng/dL (ref 0.82–1.77)
TSH: 1.16 u[IU]/mL (ref 0.450–4.500)

## 2021-11-19 LAB — VITAMIN D 25 HYDROXY (VIT D DEFICIENCY, FRACTURES): Vit D, 25-Hydroxy: 23 ng/mL — ABNORMAL LOW (ref 30.0–100.0)

## 2021-11-24 ENCOUNTER — Ambulatory Visit (HOSPITAL_COMMUNITY)
Admission: RE | Admit: 2021-11-24 | Discharge: 2021-11-24 | Disposition: A | Discharge status: Home / Self Care | Payer: Medicare Other | Source: Ambulatory Visit | Attending: Internal Medicine | Admitting: Internal Medicine

## 2021-11-24 DIAGNOSIS — J45909 Unspecified asthma, uncomplicated: Secondary | ICD-10-CM | POA: Insufficient documentation

## 2021-11-24 DIAGNOSIS — R7303 Prediabetes: Secondary | ICD-10-CM | POA: Insufficient documentation

## 2021-11-24 DIAGNOSIS — Z1231 Encounter for screening mammogram for malignant neoplasm of breast: Secondary | ICD-10-CM | POA: Diagnosis not present

## 2021-11-24 DIAGNOSIS — Z7689 Persons encountering health services in other specified circumstances: Secondary | ICD-10-CM | POA: Insufficient documentation

## 2021-11-24 NOTE — Assessment & Plan Note (Signed)
She is currently on vitamin D supplementation, 2000 units daily.  Repeat vitamin D level today.

## 2021-11-24 NOTE — Assessment & Plan Note (Addendum)
BP 135/75 initially, improved to 125/80 on repeat.  She is currently prescribed bisoprolol-HCTZ 5-6.25 mg daily. -No changes today

## 2021-11-24 NOTE — Assessment & Plan Note (Signed)
Asymptomatic currently.  Unremarkable pulmonary exam.  She has albuterol inhaler for as needed use but has not needed to use inhaler recently

## 2021-11-24 NOTE — Assessment & Plan Note (Signed)
She returns to reestablish care today.  Recent medical records and lab work reviewed. -Outstanding vaccines declined today. -Cologuard ordered for colorectal cancer screening. -Mammogram ordered today -Basic lab work ordered.

## 2021-11-24 NOTE — Assessment & Plan Note (Signed)
HbA1c 5.7 on most recent labs.  We discussed lifestyle modifications including limiting fatty/fried foods and incorporating more fruits, nuts, vegetables, and lean proteins into her diet.  We also discussed the national recommendation for at least 150 total minutes of moderate density exercise on a weekly basis.

## 2021-11-24 NOTE — Assessment & Plan Note (Signed)
Bilateral impacted cerumen noted on exam today.  She reports that her ears feel stopped up.  Both ears successfully irrigated.

## 2021-11-29 ENCOUNTER — Telehealth: Payer: Self-pay | Admitting: Internal Medicine

## 2021-11-29 NOTE — Telephone Encounter (Signed)
Returned patient call.

## 2021-11-29 NOTE — Telephone Encounter (Signed)
Pt called wanting to know the results of her mammogram. Can you please call patient with these results when available?

## 2021-12-01 DIAGNOSIS — L110 Acquired keratosis follicularis: Secondary | ICD-10-CM | POA: Diagnosis not present

## 2021-12-01 DIAGNOSIS — M79675 Pain in left toe(s): Secondary | ICD-10-CM | POA: Diagnosis not present

## 2021-12-01 DIAGNOSIS — M79674 Pain in right toe(s): Secondary | ICD-10-CM | POA: Diagnosis not present

## 2021-12-01 DIAGNOSIS — M79672 Pain in left foot: Secondary | ICD-10-CM | POA: Diagnosis not present

## 2021-12-01 DIAGNOSIS — L853 Xerosis cutis: Secondary | ICD-10-CM | POA: Diagnosis not present

## 2021-12-01 DIAGNOSIS — M79671 Pain in right foot: Secondary | ICD-10-CM | POA: Diagnosis not present

## 2022-02-02 ENCOUNTER — Ambulatory Visit (INDEPENDENT_AMBULATORY_CARE_PROVIDER_SITE_OTHER): Payer: Medicare Other

## 2022-02-02 DIAGNOSIS — Z0000 Encounter for general adult medical examination without abnormal findings: Secondary | ICD-10-CM | POA: Diagnosis not present

## 2022-02-02 DIAGNOSIS — Z1211 Encounter for screening for malignant neoplasm of colon: Secondary | ICD-10-CM | POA: Diagnosis not present

## 2022-02-02 NOTE — Patient Instructions (Signed)
Tonya Nelson , Thank you for taking time to come for your Medicare Wellness Visit. I appreciate your ongoing commitment to your health goals. Please review the following plan we discussed and let me know if I can assist you in the future.   Screening recommendations/referrals: Colonoscopy: Ordered Cologuard Mammogram: Completed Bone Density: Completed Recommended yearly ophthalmology/optometry visit for glaucoma screening and checkup Recommended yearly dental visit for hygiene and checkup  Vaccinations: Influenza vaccine: Due Pneumococcal vaccine: Due Tdap vaccine: Due Shingles vaccine: Due    Advanced directives: Patient declined  Conditions/risks identified: falls, hypertension  Next appointment: 1 year   Preventive Care 69 Years and Older, Female Preventive care refers to lifestyle choices and visits with your health care provider that can promote health and wellness. What does preventive care include? A yearly physical exam. This is also called an annual well check. Dental exams once or twice a year. Routine eye exams. Ask your health care provider how often you should have your eyes checked. Personal lifestyle choices, including: Daily care of your teeth and gums. Regular physical activity. Eating a healthy diet. Avoiding tobacco and drug use. Limiting alcohol use. Practicing safe sex. Taking low-dose aspirin every day. Taking vitamin and mineral supplements as recommended by your health care provider. What happens during an annual well check? The services and screenings done by your health care provider during your annual well check will depend on your age, overall health, lifestyle risk factors, and family history of disease. Counseling  Your health care provider may ask you questions about your: Alcohol use. Tobacco use. Drug use. Emotional well-being. Home and relationship well-being. Sexual activity. Eating habits. History of falls. Memory and ability to  understand (cognition). Work and work Statistician. Reproductive health. Screening  You may have the following tests or measurements: Height, weight, and BMI. Blood pressure. Lipid and cholesterol levels. These may be checked every 5 years, or more frequently if you are over 18 years old. Skin check. Lung cancer screening. You may have this screening every year starting at age 20 if you have a 30-pack-year history of smoking and currently smoke or have quit within the past 15 years. Fecal occult blood test (FOBT) of the stool. You may have this test every year starting at age 78. Flexible sigmoidoscopy or colonoscopy. You may have a sigmoidoscopy every 5 years or a colonoscopy every 10 years starting at age 52. Hepatitis C blood test. Hepatitis B blood test. Sexually transmitted disease (STD) testing. Diabetes screening. This is done by checking your blood sugar (glucose) after you have not eaten for a while (fasting). You may have this done every 1-3 years. Bone density scan. This is done to screen for osteoporosis. You may have this done starting at age 22. Mammogram. This may be done every 1-2 years. Talk to your health care provider about how often you should have regular mammograms. Talk with your health care provider about your test results, treatment options, and if necessary, the need for more tests. Vaccines  Your health care provider may recommend certain vaccines, such as: Influenza vaccine. This is recommended every year. Tetanus, diphtheria, and acellular pertussis (Tdap, Td) vaccine. You may need a Td booster every 10 years. Zoster vaccine. You may need this after age 84. Pneumococcal 13-valent conjugate (PCV13) vaccine. One dose is recommended after age 47. Pneumococcal polysaccharide (PPSV23) vaccine. One dose is recommended after age 70. Talk to your health care provider about which screenings and vaccines you need and how often you need them.  This information is not  intended to replace advice given to you by your health care provider. Make sure you discuss any questions you have with your health care provider. Document Released: 02/27/2015 Document Revised: 10/21/2015 Document Reviewed: 12/02/2014 Elsevier Interactive Patient Education  2017 [place] Prevention in the Home Falls can cause injuries. They can happen to people of all ages. There are many things you can do to make your home safe and to help prevent falls. What can I do on the outside of my home? Regularly fix the edges of walkways and driveways and fix any cracks. Remove anything that might make you trip as you walk through a door, such as a raised step or threshold. Trim any bushes or trees on the path to your home. Use bright outdoor lighting. Clear any walking paths of anything that might make someone trip, such as rocks or tools. Regularly check to see if handrails are loose or broken. Make sure that both sides of any steps have handrails. Any raised decks and porches should have guardrails on the edges. Have any leaves, snow, or ice cleared regularly. Use sand or salt on walking paths during winter. Clean up any spills in your garage right away. This includes oil or grease spills. What can I do in the bathroom? Use night lights. Install grab bars by the toilet and in the tub and shower. Do not use towel bars as grab bars. Use non-skid mats or decals in the tub or shower. If you need to sit down in the shower, use a plastic, non-slip stool. Keep the floor dry. Clean up any water that spills on the floor as soon as it happens. Remove soap buildup in the tub or shower regularly. Attach bath mats securely with double-sided non-slip rug tape. Do not have throw rugs and other things on the floor that can make you trip. What can I do in the bedroom? Use night lights. Make sure that you have a light by your bed that is easy to reach. Do not use any sheets or blankets that are  too big for your bed. They should not hang down onto the floor. Have a firm chair that has side arms. You can use this for support while you get dressed. Do not have throw rugs and other things on the floor that can make you trip. What can I do in the kitchen? Clean up any spills right away. Avoid walking on wet floors. Keep items that you use a lot in easy-to-reach places. If you need to reach something above you, use a strong step stool that has a grab bar. Keep electrical cords out of the way. Do not use floor polish or wax that makes floors slippery. If you must use wax, use non-skid floor wax. Do not have throw rugs and other things on the floor that can make you trip. What can I do with my stairs? Do not leave any items on the stairs. Make sure that there are handrails on both sides of the stairs and use them. Fix handrails that are broken or loose. Make sure that handrails are as long as the stairways. Check any carpeting to make sure that it is firmly attached to the stairs. Fix any carpet that is loose or worn. Avoid having throw rugs at the top or bottom of the stairs. If you do have throw rugs, attach them to the floor with carpet tape. Make sure that you have a light switch at the  top of the stairs and the bottom of the stairs. If you do not have them, ask someone to add them for you. What else can I do to help prevent falls? Wear shoes that: Do not have high heels. Have rubber bottoms. Are comfortable and fit you well. Are closed at the toe. Do not wear sandals. If you use a stepladder: Make sure that it is fully opened. Do not climb a closed stepladder. Make sure that both sides of the stepladder are locked into place. Ask someone to hold it for you, if possible. Clearly mark and make sure that you can see: Any grab bars or handrails. First and last steps. Where the edge of each step is. Use tools that help you move around (mobility aids) if they are needed. These  include: Canes. Walkers. Scooters. Crutches. Turn on the lights when you go into a dark area. Replace any light bulbs as soon as they burn out. Set up your furniture so you have a clear path. Avoid moving your furniture around. If any of your floors are uneven, fix them. If there are any pets around you, be aware of where they are. Review your medicines with your doctor. Some medicines can make you feel dizzy. This can increase your chance of falling. Ask your doctor what other things that you can do to help prevent falls. This information is not intended to replace advice given to you by your health care provider. Make sure you discuss any questions you have with your health care provider. Document Released: 11/27/2008 Document Revised: 07/09/2015 Document Reviewed: 03/07/2014 Elsevier Interactive Patient Education  2017 [employer].

## 2022-02-02 NOTE — Progress Notes (Signed)
Subjective:   Tonya Nelson is a 66 y.o. female who presents for Medicare Annual (Subsequent) preventive examination.  I connected with  Tonya Nelson on 02/02/22 by a audio enabled telemedicine application and verified that I am speaking with the correct person using two identifiers.  Patient Location: Home  Provider Location: Office/Clinic  I discussed the limitations of evaluation and management by telemedicine. The patient expressed understanding and agreed to proceed.   Review of Systems     Tonya Nelson , Thank you for taking time to come for your Medicare Wellness Visit. I appreciate your ongoing commitment to your health goals. Please review the following plan we discussed and let me know if I can assist you in the future.   These are the goals we discussed:  Goals      Social and Functional Skills Optimized     Evidence-based guidance:  Assess level of social support; promote maintaining links with family, friends and community to reduce social isolation.  Assess level of function related to basic activities of daily living that include eating, dressing, bathing, as well as instrumental activities of daily living such as shopping, managing finances and use of devices.  Encourage continuation of daily life components such as self-care, home maintenance, financial management, volunteer activities, education opportunities and hobbies.  Refer to occupational or physical therapy to develop comprehensive rehabilitation plan to improve or maintain activities of daily living; consider inclusion of endurance, balance and resistance-training.  Consider complementary therapy such as yoga, music, gardening, outdoor activities, aromatherapy and tai chi.    Pay off debts         This is a list of the screening recommended for you and due dates:  Health Maintenance  Topic Date Due   DTaP/Tdap/Td vaccine (1 - Tdap) Never done   Colon Cancer Screening  Never done   Zoster (Shingles)  Vaccine (1 of 2) Never done   Pneumonia Vaccine (1 - PCV) Never done   Flu Shot  05/15/2022*   Medicare Annual Wellness Visit  02/03/2023   Mammogram  11/25/2023   DEXA scan (bone density measurement)  Completed   Hepatitis C Screening: USPSTF Recommendation to screen - Ages 52-79 yo.  Completed   HPV Vaccine  Aged Out   COVID-19 Vaccine  Discontinued  *Topic was postponed. The date shown is not the original due date.          Objective:    There were no vitals filed for this visit. There is no height or weight on file to calculate BMI.     05/20/2019    9:50 AM  Advanced Directives  Does Patient Have a Medical Advance Directive? No  Would patient like information on creating a medical advance directive? No - Patient declined    Current Medications (verified) Outpatient Encounter Medications as of 02/02/2022  Medication Sig   albuterol (VENTOLIN HFA) 108 (90 Base) MCG/ACT inhaler Inhale 2 puffs into the lungs every 6 (six) hours as needed for wheezing or shortness of breath.   ammonium lactate (AMLACTIN) 12 % cream Apply topically 2 (two) times daily as needed.   bisoprolol-hydrochlorothiazide (ZIAC) 5-6.25 MG tablet Take 1 tablet by mouth daily.   cholecalciferol (VITAMIN D) 1000 units tablet Take 2,000 Units by mouth daily.   diclofenac (VOLTAREN) 25 MG EC tablet Take 1 tablet (25 mg total) by mouth 2 (two) times daily.   nystatin (NYSTATIN) powder APPLY TO THE AFFECTED AREA THREE TIMES DAILY   No facility-administered  encounter medications on file as of 02/02/2022.    Allergies (verified) Penicillins   History: Past Medical History:  Diagnosis Date   Asthma    Cellulitis    in toe on left foot   Endometrial polyp 12/2014   Essential hypertension    Pain in left upper arm 05/15/2019   PMB (postmenopausal bleeding) 11/18/2014   Porokeratosis    Superficial fungus infection of skin 06/04/2019   Thickened endometrium 05/23/2017   Will get endo bx   Past Surgical  History:  Procedure Laterality Date   WISDOM TOOTH EXTRACTION     Family History  Problem Relation Age of Onset   Osteoporosis Mother    Stomach cancer Mother    Cancer Mother    Bone cancer Father    Cancer Father    Parkinson's disease Brother    Alzheimer's disease Brother    Diabetes Maternal Grandmother    Glaucoma Maternal Grandfather    Social History   Socioeconomic History   Marital status: Widowed    Spouse name: Not on file   Number of children: 1   Years of education: Not on file   Highest education level: Some college, no degree  Occupational History   Occupation: Retired  Tobacco Use   Smoking status: Never   Smokeless tobacco: Never  Vaping Use   Vaping Use: Never used  Substance and Sexual Activity   Alcohol use: No   Drug use: No   Sexual activity: Not Currently    Birth control/protection: Post-menopausal  Other Topics Concern   Not on file  Social History Narrative   Lives with Son      Enjoys: music-concert, reading, being outside      Diet: eats all food groups   Caffeine: half and half tea-decaff usually   Water: 6-8 cups daily      Wears seat belt   Does not use phone while driving    [place] at home   Right handed    Social Determinants of Health   Financial Resource Strain: [place]  (07/15/2020)   Overall Financial Resource Strain (CARDIA)    Difficulty of Paying Living Expenses: Not hard at all  Food Insecurity: No Food Insecurity (07/15/2020)   Hunger Vital Sign    Worried About Running Out of Food in the Last Year: Never true    [place] in the Last Year: Never true  Transportation Needs: No Transportation Needs (07/15/2020)   PRAPARE - Hydrologist (Medical): No    Lack of Transportation (Non-Medical): No  Physical Activity: Insufficiently Active (07/15/2020)   Exercise Vital Sign    Days of Exercise per Week: 2 days    Minutes of Exercise per Session: 30 min  Stress: No Stress  Concern Present (07/15/2020)   [place]    Feeling of Stress : Not at all  Social Connections: Moderately Isolated (07/15/2020)   Social Connection and Isolation Panel [NHANES]    Frequency of Communication with Friends and Family: Three times a week    Frequency of Social Gatherings with Friends and Family: Twice a week    Attends Religious Services: More than 4 times per year    Active Member of [employer] or Organizations: No    Attends Archivist Meetings: Never    Marital Status: Widowed    Tobacco Counseling Counseling given: Not Answered   Clinical Intake:  Diabetic? No         Activities of Daily Living     No data to display           Patient Care Team: Johnette Abraham, MD as PCP - General (Internal Medicine)  Indicate any recent Medical Services you may have received from other than [facility] providers in the past year (date may be approximate).     Assessment:   This is a routine wellness examination for Tonya Nelson.  Hearing/Vision screen No results found.  Dietary issues and exercise activities discussed:     Goals Addressed   None   Depression Screen    11/18/2021    1:22 PM 02/01/2021    8:54 AM 12/22/2020   10:53 AM 12/07/2020    2:31 PM 07/20/2020   11:11 AM 07/15/2020    9:25 AM 07/15/2020    9:22 AM  PHQ 2/9 Scores  PHQ - 2 Score 0 0 0 0 0 0 0    Fall Risk    11/18/2021    1:21 PM 02/01/2021    8:54 AM 01/20/2021    9:25 AM 12/22/2020   10:53 AM 12/22/2020   10:52 AM  [place] in the past year? 0 0 0 0 0  Number falls in past yr: 0 0 0 0 0  Injury with Fall? 0 0 0 0 0  Risk for fall due to : No Fall Risks No Fall Risks  No Fall Risks No Fall Risks  Follow up Falls evaluation completed Falls evaluation completed  Falls evaluation completed     [place]:  Any stairs in or around the home? No  If so,  are there any without handrails?  N/A Home free of loose throw rugs in walkways, pet beds, electrical cords, etc? Yes  Adequate lighting in your home to reduce risk of falls? Yes   ASSISTIVE DEVICES UTILIZED TO PREVENT FALLS:  Life alert? No  Use of a cane, walker or w/c? No  Grab bars in the bathroom? Yes  Shower chair or bench in shower? No  Elevated toilet seat or a handicapped toilet? No     Cognitive Function:        07/15/2020    9:25 AM  6CIT Screen  What Year? 0 points  What month? 0 points  What time? 0 points  Count back from 20 0 points  Months in reverse 0 points  Repeat phrase 0 points  Total Score 0 points    Immunizations  There is no immunization history on file for this patient.  TDAP status: Due, Education has been provided regarding the importance of this vaccine. Advised may receive this vaccine at local pharmacy or Health Dept. Aware to provide a copy of the vaccination record if obtained from local pharmacy or Health Dept. Verbalized acceptance and understanding.  Flu Vaccine status: Due, Education has been provided regarding the importance of this vaccine. Advised may receive this vaccine at local pharmacy or Health Dept. Aware to provide a copy of the vaccination record if obtained from local pharmacy or Health Dept. Verbalized acceptance and understanding.  Pneumococcal vaccine status: Due, Education has been provided regarding the importance of this vaccine. Advised may receive this vaccine at local pharmacy or Health Dept. Aware to provide a copy of the vaccination record if obtained from local pharmacy or Health Dept. Verbalized acceptance and understanding.  Covid-19 vaccine status: Declined, Education has been provided regarding the  importance of this vaccine but patient still declined. Advised may receive this vaccine at local pharmacy or Health Dept.or vaccine clinic. Aware to provide a copy of the vaccination record if obtained from local  pharmacy or Health Dept. Verbalized acceptance and understanding.  Qualifies for Shingles Vaccine? Yes   Zostavax completed No   Shingrix Completed?: No.    Education has been provided regarding the importance of this vaccine. Patient has been advised to call insurance company to determine out of pocket expense if they have not yet received this vaccine. Advised may also receive vaccine at local pharmacy or Health Dept. Verbalized acceptance and understanding.  Screening Tests Health Maintenance  Topic Date Due   DTaP/Tdap/Td (1 - Tdap) Never done   COLONOSCOPY (Pts 45-48yr Insurance coverage will need to be confirmed)  Never done   Zoster Vaccines- Shingrix (1 of 2) Never done   Pneumonia Vaccine 67 Years old (1 - PCV) Never done   INFLUENZA VACCINE  05/15/2022 (Originally 09/14/2021)   Medicare Annual Wellness (AWV)  02/03/2023   MAMMOGRAM  11/25/2023   DEXA SCAN  Completed   Hepatitis C Screening  Completed   HPV VACCINES  Aged Out   COVID-19 Vaccine  Discontinued    Health Maintenance  Health Maintenance Due  Topic Date Due   DTaP/Tdap/Td (1 - Tdap) Never done   COLONOSCOPY (Pts 45-494yrInsurance coverage will need to be confirmed)  Never done   Zoster Vaccines- Shingrix (1 of 2) Never done   Pneumonia Vaccine 6547Years old (1 - PCV) Never done    Colorectal cancer screening: No longer required.   Mammogram status: Completed 11/24/2021. Repeat every year  Bone Density status: Completed 05/03/2019. Results reflect: Bone density results: NORMAL. Repeat every 5 years.  Lung Cancer Screening: (Low Dose CT Chest recommended if Age 67-80ears, 30 pack-year currently smoking OR have quit w/in 15years.) does not qualify.   Lung Cancer Screening Referral:   Additional Screening:  Hepatitis C Screening: does qualify; Completed 01/12/2021  Vision Screening: Recommended annual ophthalmology exams for early detection of glaucoma and other disorders of the eye. Is the patient  up to date with their annual eye exam?  No  Who is the provider or what is the name of the office in which the patient attends annual eye exams? Dr. GrKaty Fitch Dental Screening: Recommended annual dental exams for proper oral hygiene  Community Resource Referral / Chronic Care Management: CRR required this visit?  No   CCM required this visit?  No      Plan:     I have personally reviewed and noted the following in the patient's chart:   Medical and social history Use of alcohol, tobacco or illicit drugs  Current medications and supplements including opioid prescriptions. Patient is not currently taking opioid prescriptions. Functional ability and status Nutritional status Physical activity Advanced directives List of other physicians Hospitalizations, surgeries, and ER visits in previous 12 months Vitals Screenings to include cognitive, depression, and falls Referrals and appointments  In addition, I have reviewed and discussed with patient certain preventive protocols, quality metrics, and best practice recommendations. A written personalized care plan for preventive services as well as general preventive health recommendations were provided to patient.     KrJohny DrillingCMSouth [place] 02/02/2022   Nurse Notes:  Ms. MuEutsler Thank you for taking time to come for your Medicare Wellness Visit. I appreciate your ongoing commitment to your health goals. Please review the following plan  we discussed and let me know if I can assist you in the future.   These are the goals we discussed:  Goals      Social and Functional Skills Optimized     Evidence-based guidance:  Assess level of social support; promote maintaining links with family, friends and community to reduce social isolation.  Assess level of function related to basic activities of daily living that include eating, dressing, bathing, as well as instrumental activities of daily living such as shopping, managing finances and  use of devices.  Encourage continuation of daily life components such as self-care, home maintenance, financial management, volunteer activities, education opportunities and hobbies.  Refer to occupational or physical therapy to develop comprehensive rehabilitation plan to improve or maintain activities of daily living; consider inclusion of endurance, balance and resistance-training.  Consider complementary therapy such as yoga, music, gardening, outdoor activities, aromatherapy and tai chi.    Pay off debts         This is a list of the screening recommended for you and due dates:  Health Maintenance  Topic Date Due   DTaP/Tdap/Td vaccine (1 - Tdap) Never done   Colon Cancer Screening  Never done   Zoster (Shingles) Vaccine (1 of 2) Never done   Pneumonia Vaccine (1 - PCV) Never done   Flu Shot  05/15/2022*   Medicare Annual Wellness Visit  02/03/2023   Mammogram  11/25/2023   DEXA scan (bone density measurement)  Completed   Hepatitis C Screening: USPSTF Recommendation to screen - Ages 70-79 yo.  Completed   HPV Vaccine  Aged Out   COVID-19 Vaccine  Discontinued  *Topic was postponed. The date shown is not the original due date.

## 2022-02-02 NOTE — Addendum Note (Signed)
Addended by: Johny Drilling on: 02/02/2022 08:52 AM   Modules accepted: Orders

## 2022-03-02 DIAGNOSIS — M79674 Pain in right toe(s): Secondary | ICD-10-CM | POA: Diagnosis not present

## 2022-03-02 DIAGNOSIS — M79675 Pain in left toe(s): Secondary | ICD-10-CM | POA: Diagnosis not present

## 2022-03-02 DIAGNOSIS — I739 Peripheral vascular disease, unspecified: Secondary | ICD-10-CM | POA: Diagnosis not present

## 2022-03-02 DIAGNOSIS — M79671 Pain in right foot: Secondary | ICD-10-CM | POA: Diagnosis not present

## 2022-03-02 DIAGNOSIS — M79672 Pain in left foot: Secondary | ICD-10-CM | POA: Diagnosis not present

## 2022-03-02 DIAGNOSIS — L110 Acquired keratosis follicularis: Secondary | ICD-10-CM | POA: Diagnosis not present

## 2022-04-08 ENCOUNTER — Encounter: Payer: Self-pay | Admitting: Internal Medicine

## 2022-04-08 ENCOUNTER — Ambulatory Visit (INDEPENDENT_AMBULATORY_CARE_PROVIDER_SITE_OTHER): Payer: Medicare Other | Admitting: Internal Medicine

## 2022-04-08 VITALS — BP 144/74 | HR 68 | Ht 66.0 in | Wt 216.2 lb

## 2022-04-08 DIAGNOSIS — J019 Acute sinusitis, unspecified: Secondary | ICD-10-CM

## 2022-04-08 DIAGNOSIS — R6889 Other general symptoms and signs: Secondary | ICD-10-CM

## 2022-04-08 DIAGNOSIS — B9789 Other viral agents as the cause of diseases classified elsewhere: Secondary | ICD-10-CM | POA: Diagnosis not present

## 2022-04-08 DIAGNOSIS — Z1152 Encounter for screening for COVID-19: Secondary | ICD-10-CM

## 2022-04-08 NOTE — Progress Notes (Signed)
Acute Office Visit  Subjective:     Patient ID: Tonya Nelson, female    DOB: 10-Nov-1955, 67 y.o.   MRN: [id number]  Chief Complaint  Patient presents with   Sore Throat    Sore throat and congestion started 04/06/2022. Low grade fever.   Tonya Nelson presents today for an acute visit endorsing a 3-day history of throat irritation and nasal congestion.  She reports onset of symptoms 2 nights ago.  Her throat irritation has gradually worsened.  She endorses nasal congestion with clear nasal secretions.  She additionally endorses chills with no fever as well as fatigue.  She has checked her temperature and reports a Tmax of 99.9 F.  She denies cough, nausea/vomiting, and diarrhea.  Tonya Nelson is unaware of any recent sick contacts.  She is not vaccinated against influenza or COVID-19.  She has tried to manage her symptoms with Tylenol with minimal symptom improvement.  Review of Systems  Constitutional:  Positive for chills and malaise/fatigue. Negative for fever.  HENT:  Positive for congestion and sore throat. Negative for ear discharge and ear pain.   Respiratory:  Negative for cough, sputum production and shortness of breath.   Cardiovascular:  Negative for chest pain.  All other systems reviewed and are negative.       Objective:    BP (!) 144/74   Pulse 68   Ht '5\' 6"'$  (1.676 m)   Wt 216 lb 3.2 oz (98.1 kg)   SpO2 95%   BMI 34.90 kg/m    Physical Exam Vitals reviewed.  Constitutional:      General: She is not in acute distress.    Appearance: Normal appearance. She is obese. She is not toxic-appearing.  HENT:     Head: Normocephalic and atraumatic.     Right Ear: External ear normal.     Left Ear: External ear normal.     Nose: Congestion present. No rhinorrhea.     Mouth/Throat:     Mouth: Mucous membranes are moist.     Pharynx: Oropharynx is clear. No oropharyngeal exudate or posterior oropharyngeal erythema.  Eyes:     General: No scleral icterus.    Extraocular  Movements: Extraocular movements intact.     Conjunctiva/sclera: Conjunctivae normal.     Pupils: Pupils are equal, round, and reactive to light.  Cardiovascular:     Rate and Rhythm: Normal rate and regular rhythm.     Pulses: Normal pulses.     Heart sounds: Normal heart sounds. No murmur heard.    No friction rub. No gallop.  Pulmonary:     Effort: Pulmonary effort is normal.     Breath sounds: Normal breath sounds. No wheezing, rhonchi or rales.  Abdominal:     General: Abdomen is flat. Bowel sounds are normal. There is no distension.     Palpations: Abdomen is soft.     Tenderness: There is no abdominal tenderness.  Musculoskeletal:        General: No swelling. Normal range of motion.     Cervical back: Normal range of motion.     Right lower leg: No edema.     Left lower leg: No edema.  Lymphadenopathy:     Cervical: No cervical adenopathy.  Skin:    General: Skin is warm and dry.     Capillary Refill: Capillary refill takes less than 2 seconds.     Coloration: Skin is not jaundiced.  Neurological:     General: No focal deficit  present.     Mental Status: She is alert and oriented to person, place, and time.  Psychiatric:        Mood and Affect: Mood normal.        Behavior: Behavior normal.       Assessment & Plan:   Problem List Items Addressed This Visit       Acute viral sinusitis - Primary    Presenting today for an acute visit for the symptoms endorsed above.  Her symptoms seem most consistent with viral sinusitis.  Today is day 3 from symptom onset. -No indication for antibiotic therapy currently.  I have recommended continued supportive care measures, which include use of nasal saline rinse followed by fluticasone nasal spray.  If she develops significant sinus pain/pressure, she can try using Sudafed.  For relief of throat irritation I have recommended use of Chloraseptic spray or lozenges. -COVID-19 testing ordered today given that she is within window for  COVID-19 specific outpatient therapy -She was instructed to return to care if she develops fever, unilateral sinus pain/pressure, her secretions become green/yellow, or if she develops a cough productive of yellow/green sputum.  She will otherwise return to care for routine follow-up on 05/23/2022.      Return if symptoms worsen or fail to improve.  Johnette Abraham, MD

## 2022-04-08 NOTE — Assessment & Plan Note (Signed)
Presenting today for an acute visit for the symptoms endorsed above.  Her symptoms seem most consistent with viral sinusitis.  Today is day 3 from symptom onset. -No indication for antibiotic therapy currently.  I have recommended continued supportive care measures, which include use of nasal saline rinse followed by fluticasone nasal spray.  If she develops significant sinus pain/pressure, she can try using Sudafed.  For relief of throat irritation I have recommended use of Chloraseptic spray or lozenges. -COVID-19 testing ordered today given that she is within window for COVID-19 specific outpatient therapy -She was instructed to return to care if she develops fever, unilateral sinus pain/pressure, her secretions become green/yellow, or if she develops a cough productive of yellow/green sputum.  She will otherwise return to care for routine follow-up on 05/23/2022.

## 2022-04-08 NOTE — Patient Instructions (Signed)
It was a pleasure to see you today.  Thank you for giving [place] the opportunity to be involved in your care.  Below is a brief recap of your visit and next steps.  We will plan to see you again in April.  Summary Your symptoms today seem most consistent with a viral infection. I recommend the following for symptom relief:  -Nasal saline rinse -Fluticasone nasal spray -Sudafed (if you develop sinus pain / pressure) -Chloraseptic spray or lozenges for relief of throat irritation  Please return to care next week if you develop fever, begin to cough yellow / green sputum, or feel worse in general

## 2022-04-11 ENCOUNTER — Other Ambulatory Visit: Payer: Self-pay

## 2022-04-11 ENCOUNTER — Telehealth: Payer: Self-pay | Admitting: Internal Medicine

## 2022-04-11 ENCOUNTER — Other Ambulatory Visit: Payer: Self-pay | Admitting: Internal Medicine

## 2022-04-11 DIAGNOSIS — U071 COVID-19: Secondary | ICD-10-CM

## 2022-04-11 DIAGNOSIS — R053 Chronic cough: Secondary | ICD-10-CM

## 2022-04-11 LAB — COVID-19, FLU A+B NAA
Influenza A, NAA: NOT DETECTED
Influenza B, NAA: NOT DETECTED
SARS-CoV-2, NAA: DETECTED — AB

## 2022-04-11 MED ORDER — BENZONATATE 100 MG PO CAPS: 100.0000 mg | ORAL_CAPSULE | Freq: Two times a day (BID) | ORAL | 0 refills | Status: DC | PRN

## 2022-04-11 MED ORDER — NIRMATRELVIR/RITONAVIR (PAXLOVID)TABLET: 3.0000 | ORAL_TABLET | Freq: Two times a day (BID) | ORAL | 0 refills | Status: AC

## 2022-04-11 NOTE — Telephone Encounter (Signed)
Spoke with patient about results.

## 2022-04-11 NOTE — Telephone Encounter (Signed)
+  covid pt & she is asking to speak to nurse? Can you please call her when available?

## 2022-05-18 DIAGNOSIS — M79671 Pain in right foot: Secondary | ICD-10-CM | POA: Diagnosis not present

## 2022-05-18 DIAGNOSIS — M79674 Pain in right toe(s): Secondary | ICD-10-CM | POA: Diagnosis not present

## 2022-05-18 DIAGNOSIS — M79675 Pain in left toe(s): Secondary | ICD-10-CM | POA: Diagnosis not present

## 2022-05-18 DIAGNOSIS — M79672 Pain in left foot: Secondary | ICD-10-CM | POA: Diagnosis not present

## 2022-05-18 DIAGNOSIS — I739 Peripheral vascular disease, unspecified: Secondary | ICD-10-CM | POA: Diagnosis not present

## 2022-05-18 DIAGNOSIS — L110 Acquired keratosis follicularis: Secondary | ICD-10-CM | POA: Diagnosis not present

## 2022-05-23 ENCOUNTER — Ambulatory Visit: Payer: Medicare Other | Admitting: Internal Medicine

## 2022-05-27 ENCOUNTER — Encounter: Payer: Self-pay | Admitting: Internal Medicine

## 2022-05-27 ENCOUNTER — Ambulatory Visit (INDEPENDENT_AMBULATORY_CARE_PROVIDER_SITE_OTHER): Payer: Medicare Other | Admitting: Internal Medicine

## 2022-05-27 VITALS — BP 124/61 | HR 67 | Ht 66.5 in | Wt 218.2 lb

## 2022-05-27 DIAGNOSIS — J45909 Unspecified asthma, uncomplicated: Secondary | ICD-10-CM

## 2022-05-27 DIAGNOSIS — Z1211 Encounter for screening for malignant neoplasm of colon: Secondary | ICD-10-CM

## 2022-05-27 DIAGNOSIS — Z1212 Encounter for screening for malignant neoplasm of rectum: Secondary | ICD-10-CM | POA: Diagnosis not present

## 2022-05-27 DIAGNOSIS — I10 Essential (primary) hypertension: Secondary | ICD-10-CM

## 2022-05-27 DIAGNOSIS — M810 Age-related osteoporosis without current pathological fracture: Secondary | ICD-10-CM | POA: Diagnosis not present

## 2022-05-27 DIAGNOSIS — E669 Obesity, unspecified: Secondary | ICD-10-CM

## 2022-05-27 NOTE — Assessment & Plan Note (Signed)
She remains on daily vitamin D supplementation. -No medication changes today.  Repeat labs at follow-up in 6 months

## 2022-05-27 NOTE — Assessment & Plan Note (Signed)
Cologuard ordered today °

## 2022-05-27 NOTE — Assessment & Plan Note (Signed)
Asymptomatic.  Unremarkable pulmonary exam today.  Has an albuterol inhaler for as needed use but uses infrequently. -No medication changes today

## 2022-05-27 NOTE — Assessment & Plan Note (Addendum)
BMI 34.6 today.  She has lost 6 pounds since October. -Reemphasized importance of dietary and lifestyle modifications aimed at weight loss. -Repeat labs in 6 months

## 2022-05-27 NOTE — Assessment & Plan Note (Signed)
She is currently prescribed bisoprolol-HCTZ 5-6.25 mg daily for treatment of hypertension.  BP today is 124/61. -No medication changes today

## 2022-05-27 NOTE — Patient Instructions (Signed)
It was a pleasure to see you today.  Thank you for giving [place] the opportunity to be involved in your care.  Below is a brief recap of your visit and next steps.  We will plan to see you again in 6 months.  Summary No medication changes today. Things look great. Follow up in 6 months Cologuard ordered today

## 2022-05-27 NOTE — Progress Notes (Signed)
Established Patient Office Visit  Subjective   Patient ID: Tonya Nelson, female    DOB: [birth year]  Age: 67 y.o. MRN: [id number]  Chief Complaint  Patient presents with   Hypertension    Follow up   Tonya Nelson returns to care today for routine follow-up.  She was last evaluated by me in February in the setting of respiratory symptoms.  Ultimately diagnosed with COVID-19 and treated with Paxlovid.  There have otherwise been no acute interval events.  Tonya Nelson reports feeling well today.  She is asymptomatic and has no acute concerns to discuss.  Past Medical History:  Diagnosis Date   Asthma    Cellulitis    in toe on left foot   Endometrial polyp 12/2014   Essential hypertension    Pain in left upper arm 05/15/2019   PMB (postmenopausal bleeding) 11/18/2014   Porokeratosis    Superficial fungus infection of skin 06/04/2019   Thickened endometrium 05/23/2017   Will get endo bx   Past Surgical History:  Procedure Laterality Date   WISDOM TOOTH EXTRACTION     Social History   Tobacco Use   Smoking status: Never   Smokeless tobacco: Never  Vaping Use   Vaping Use: Never used  Substance Use Topics   Alcohol use: No   Drug use: No   Family History  Problem Relation Age of Onset   Osteoporosis Mother    Stomach cancer Mother    Cancer Mother    Bone cancer Father    Cancer Father    Parkinson's disease Brother    Alzheimer's disease Brother    Diabetes Maternal Grandmother    Glaucoma Maternal Grandfather    Allergies  Allergen Reactions   Penicillins Rash   Review of Systems  Constitutional:  Negative for chills and fever.  HENT:  Negative for sore throat.   Respiratory:  Negative for cough and shortness of breath.   Cardiovascular:  Negative for chest pain, palpitations and leg swelling.  Gastrointestinal:  Negative for abdominal pain, blood in stool, constipation, diarrhea, nausea and vomiting.  Genitourinary:  Negative for dysuria and hematuria.   Musculoskeletal:  Negative for myalgias.  Skin:  Negative for itching and rash.  Neurological:  Negative for dizziness and headaches.  Psychiatric/Behavioral:  Negative for depression and suicidal ideas.      Objective:     BP 124/61   Pulse 67   Ht 5' 6.5" (1.689 m)   Wt 218 lb 3.2 oz (99 kg)   SpO2 96%   BMI 34.69 kg/m  BP Readings from Last 3 Encounters:  05/27/22 124/61  04/08/22 (!) 144/74  11/18/21 135/75   Physical Exam Vitals reviewed.  Constitutional:      General: She is not in acute distress.    Appearance: Normal appearance. She is obese. She is not toxic-appearing.  HENT:     Head: Normocephalic and atraumatic.     Right Ear: External ear normal.     Left Ear: External ear normal.     Nose: No congestion or rhinorrhea.     Mouth/Throat:     Mouth: Mucous membranes are moist.     Pharynx: Oropharynx is clear. No oropharyngeal exudate or posterior oropharyngeal erythema.  Eyes:     General: No scleral icterus.    Extraocular Movements: Extraocular movements intact.     Conjunctiva/sclera: Conjunctivae normal.     Pupils: Pupils are equal, round, and reactive to light.  Cardiovascular:     Rate  and Rhythm: Normal rate and regular rhythm.     Pulses: Normal pulses.     Heart sounds: Normal heart sounds. No murmur heard.    No friction rub. No gallop.  Pulmonary:     Effort: Pulmonary effort is normal.     Breath sounds: Normal breath sounds. No wheezing, rhonchi or rales.  Abdominal:     General: Abdomen is flat. Bowel sounds are normal. There is no distension.     Palpations: Abdomen is soft.     Tenderness: There is no abdominal tenderness.  Musculoskeletal:        General: No swelling. Normal range of motion.     Cervical back: Normal range of motion.     Right lower leg: No edema.     Left lower leg: No edema.  Lymphadenopathy:     Cervical: No cervical adenopathy.  Skin:    General: Skin is warm and dry.     Capillary Refill: Capillary  refill takes less than 2 seconds.     Coloration: Skin is not jaundiced.  Neurological:     General: No focal deficit present.     Mental Status: She is alert and oriented to person, place, and time.  Psychiatric:        Mood and Affect: Mood normal.        Behavior: Behavior normal.   Last CBC Lab Results  Component Value Date   WBC 8.5 11/18/2021   HGB 14.0 11/18/2021   HCT 42.4 11/18/2021   MCV 87 11/18/2021   MCH 28.7 11/18/2021   RDW 13.1 11/18/2021   PLT 246 11/18/2021   Last metabolic panel Lab Results  Component Value Date   GLUCOSE 104 (H) 11/18/2021   NA 141 11/18/2021   K 3.7 11/18/2021   CL 101 11/18/2021   CO2 24 11/18/2021   BUN 11 11/18/2021   CREATININE 0.74 11/18/2021   EGFR 89 11/18/2021   CALCIUM 9.6 11/18/2021   PROT 7.3 11/18/2021   ALBUMIN 4.4 11/18/2021   LABGLOB 2.9 11/18/2021   AGRATIO 1.5 11/18/2021   BILITOT 0.3 11/18/2021   ALKPHOS 44 11/18/2021   AST 35 11/18/2021   ALT 35 (H) 11/18/2021   Last lipids Lab Results  Component Value Date   CHOL 173 11/18/2021   HDL 34 (L) 11/18/2021   LDLCALC 93 11/18/2021   TRIG 274 (H) 11/18/2021   CHOLHDL 5.1 (H) 11/18/2021   Last hemoglobin A1c Lab Results  Component Value Date   HGBA1C 5.1 01/20/2021   Last thyroid functions Lab Results  Component Value Date   TSH 1.160 11/18/2021   Last vitamin D Lab Results  Component Value Date   VD25OH 23.0 (L) 11/18/2021    The 10-year ASCVD risk score (Arnett DK, et al., 2019) is: 9.5%    Assessment & Plan:   Problem List Items Addressed This Visit       Essential hypertension    She is currently prescribed bisoprolol-HCTZ 5-6.25 mg daily for treatment of hypertension.  BP today is 124/61. -No medication changes today      Asthma    Asymptomatic.  Unremarkable pulmonary exam today.  Has an albuterol inhaler for as needed use but uses infrequently. -No medication changes today      Osteoporosis    She remains on daily vitamin D  supplementation. -No medication changes today.  Repeat labs at follow-up in 6 months      Screening for colorectal cancer    Cologuard ordered today  Obesity (BMI 30.0-34.9)    BMI 34.6 today.  She has lost 6 pounds since October. -Reemphasized importance of dietary and lifestyle modifications aimed at weight loss. -Repeat labs in 6 months      Return in about 6 months (around 11/26/2022).   Billie Lade, MD

## 2022-06-22 ENCOUNTER — Other Ambulatory Visit: Payer: Self-pay

## 2022-06-22 ENCOUNTER — Telehealth: Payer: Self-pay | Admitting: Internal Medicine

## 2022-06-22 MED ORDER — NYSTATIN 100000 UNIT/GM EX POWD: 1.0000 | Freq: Three times a day (TID) | CUTANEOUS | 3 refills | Status: AC

## 2022-06-22 NOTE — Telephone Encounter (Signed)
Pt called and requested refill on med. Pt stated she has a rash and needs refill.    nystatin (MYCOSTATIN/NYSTOP) powder

## 2022-06-22 NOTE — Telephone Encounter (Signed)
Refills sent

## 2022-07-07 IMAGING — MG DIGITAL SCREENING BILAT W/ TOMO W/ CAD
8 series · 8 of 24 positions shown · non-contrast
Comparison: None.

CLINICAL DATA: Screening.

EXAM:
DIGITAL SCREENING BILATERAL MAMMOGRAM WITH TOMO AND CAD

[R CC synth-2D]
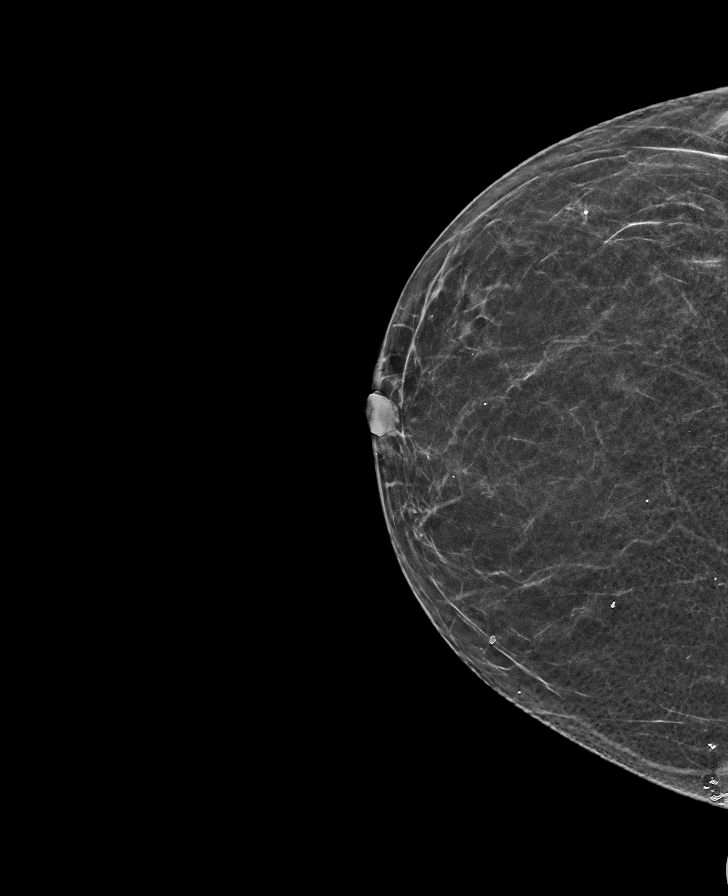

[R MLO synth-2D]
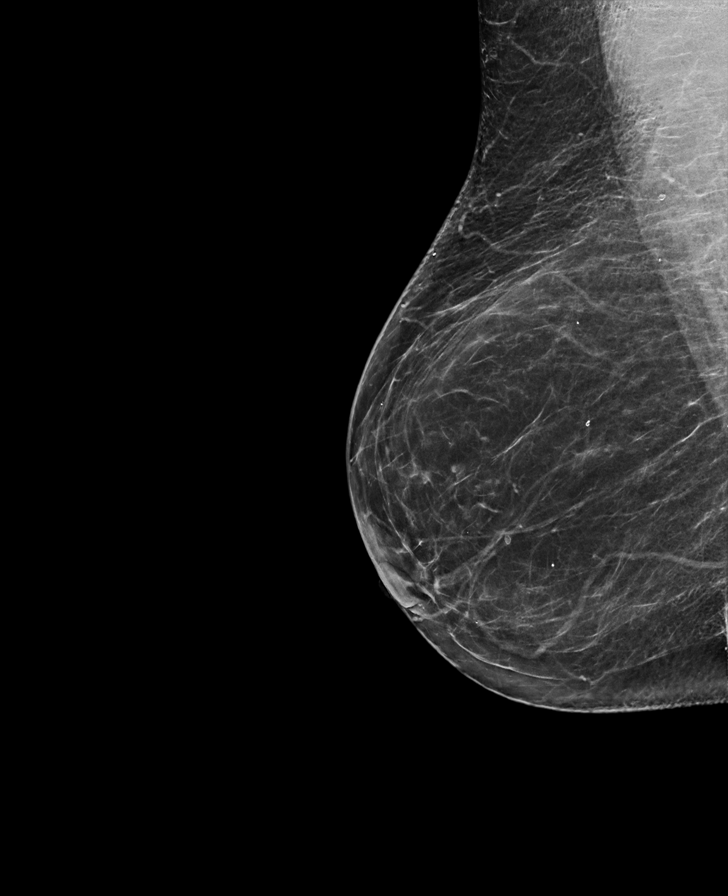

[L CC synth-2D]
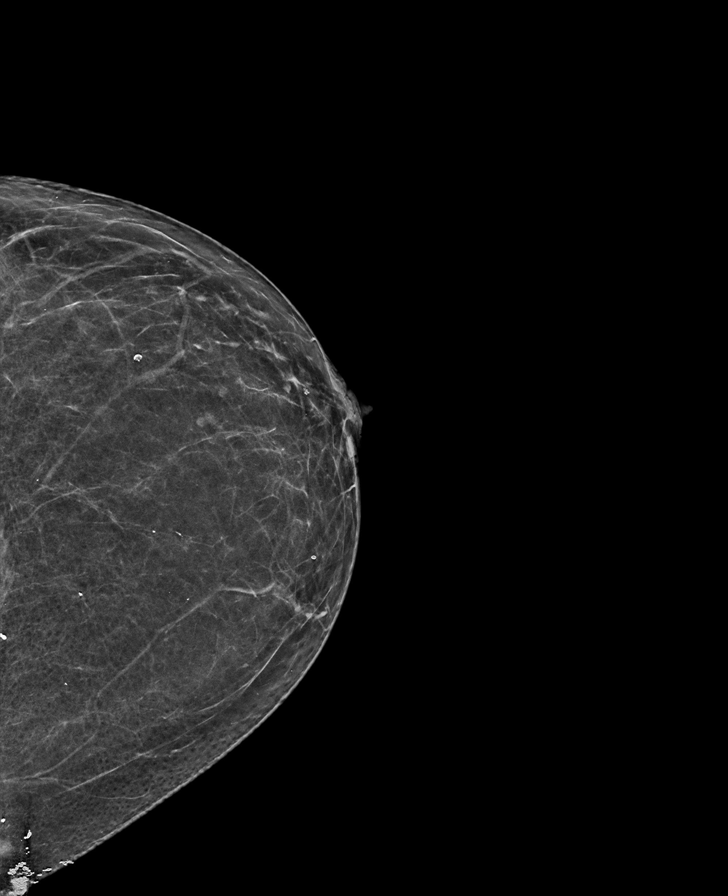

[L MLO synth-2D]
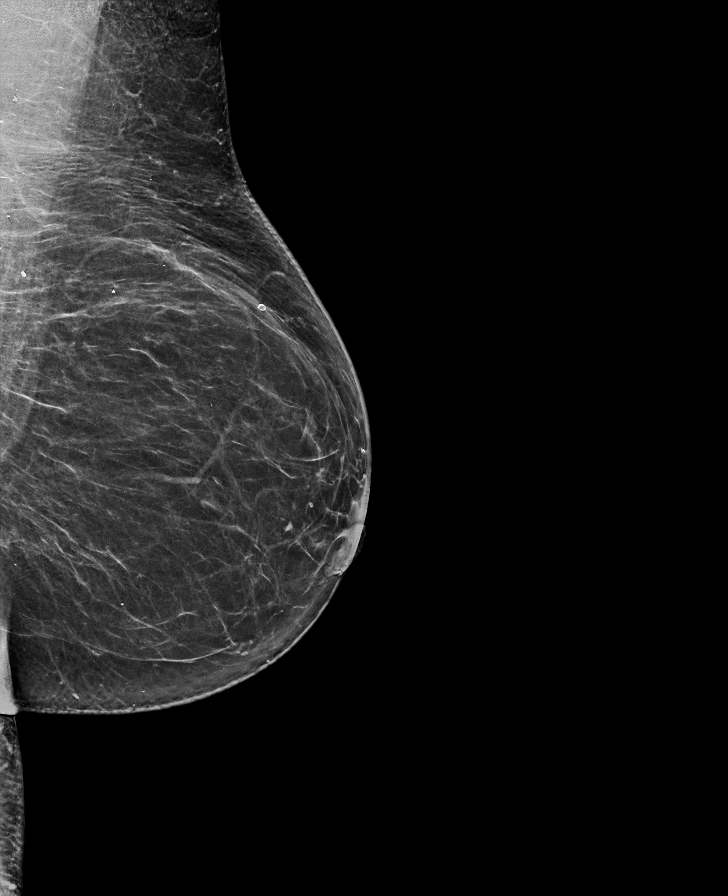

[L CC tomo · tomo slice 33/64.0]
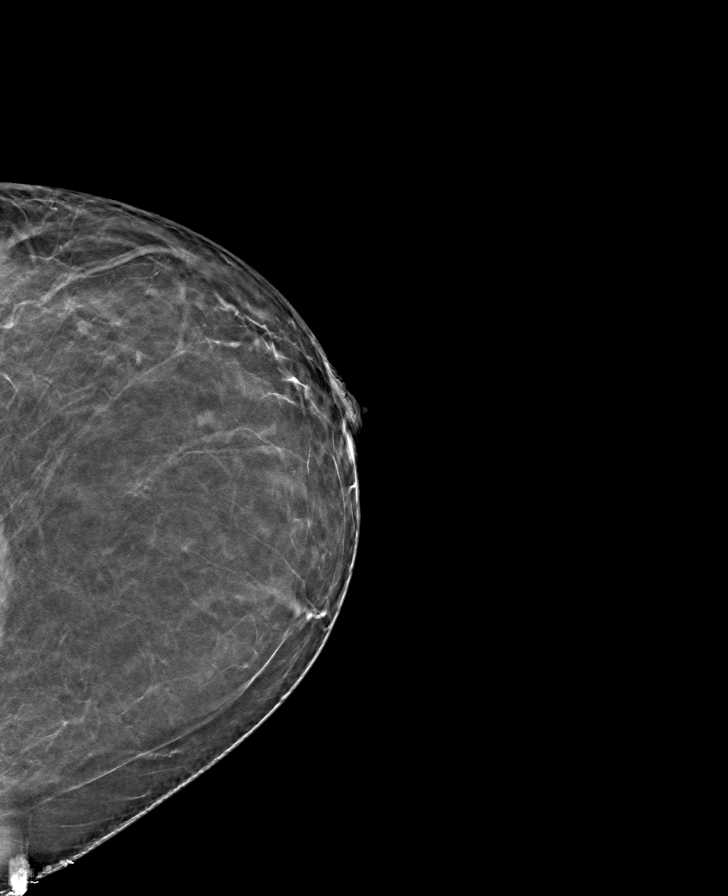

[R MLO tomo · tomo slice 37/74.0]
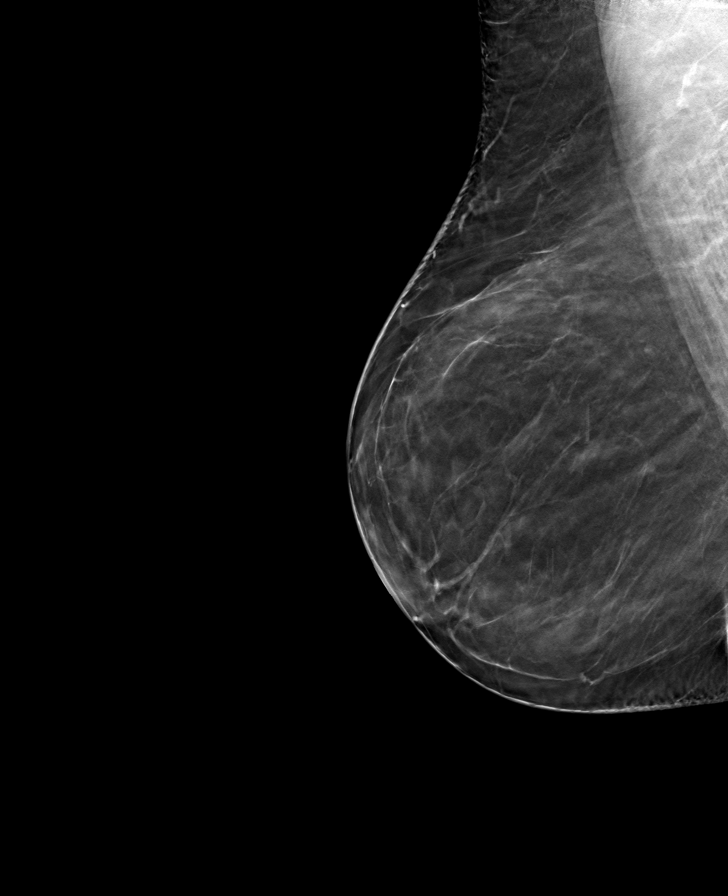

[R CC tomo · tomo slice 28/55.0]
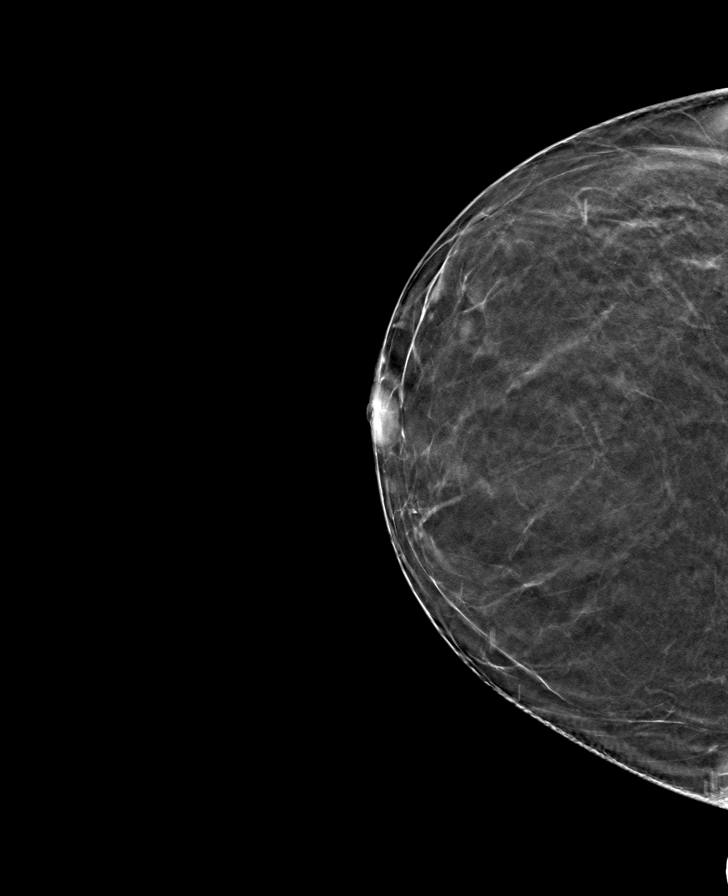

[L MLO tomo · tomo slice 37/74.0]
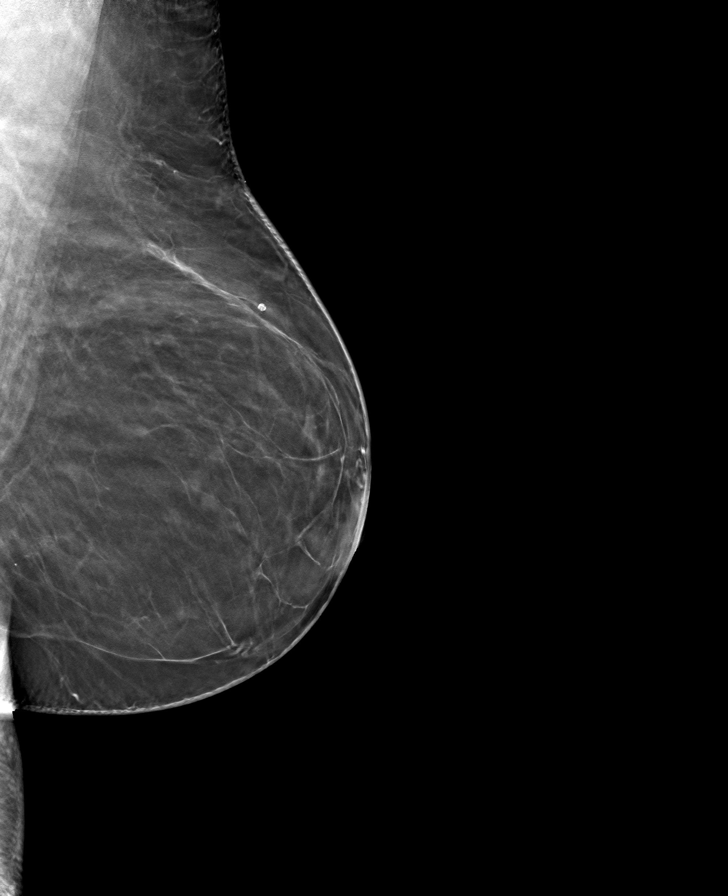

[8 of 24 positions shown; findings below may reference images not displayed]

ACR Breast Density Category b: There are scattered areas of
fibroglandular density.
FINDINGS: There are no findings suspicious for malignancy. Images were
processed with CAD.
IMPRESSION: No mammographic evidence of malignancy. A result letter of this
screening mammogram will be mailed directly to the patient.

RECOMMENDATION:
Screening mammogram in one year. (Code:Y5-G-EJ6)

BI-RADS CATEGORY  1: Negative.

## 2022-08-03 DIAGNOSIS — M79672 Pain in left foot: Secondary | ICD-10-CM | POA: Diagnosis not present

## 2022-08-03 DIAGNOSIS — M79674 Pain in right toe(s): Secondary | ICD-10-CM | POA: Diagnosis not present

## 2022-08-03 DIAGNOSIS — L110 Acquired keratosis follicularis: Secondary | ICD-10-CM | POA: Diagnosis not present

## 2022-08-03 DIAGNOSIS — I739 Peripheral vascular disease, unspecified: Secondary | ICD-10-CM | POA: Diagnosis not present

## 2022-08-03 DIAGNOSIS — M79675 Pain in left toe(s): Secondary | ICD-10-CM | POA: Diagnosis not present

## 2022-08-03 DIAGNOSIS — M79671 Pain in right foot: Secondary | ICD-10-CM | POA: Diagnosis not present

## 2022-10-27 ENCOUNTER — Other Ambulatory Visit: Payer: Self-pay | Admitting: Internal Medicine

## 2022-11-02 DIAGNOSIS — M79675 Pain in left toe(s): Secondary | ICD-10-CM | POA: Diagnosis not present

## 2022-11-02 DIAGNOSIS — M79672 Pain in left foot: Secondary | ICD-10-CM | POA: Diagnosis not present

## 2022-11-02 DIAGNOSIS — M79671 Pain in right foot: Secondary | ICD-10-CM | POA: Diagnosis not present

## 2022-11-02 DIAGNOSIS — I739 Peripheral vascular disease, unspecified: Secondary | ICD-10-CM | POA: Diagnosis not present

## 2022-11-02 DIAGNOSIS — L110 Acquired keratosis follicularis: Secondary | ICD-10-CM | POA: Diagnosis not present

## 2022-11-02 DIAGNOSIS — M79674 Pain in right toe(s): Secondary | ICD-10-CM | POA: Diagnosis not present

## 2022-11-25 ENCOUNTER — Ambulatory Visit: Payer: Medicare Other | Admitting: Internal Medicine

## 2022-12-13 ENCOUNTER — Ambulatory Visit: Payer: Medicare Other | Admitting: Internal Medicine

## 2023-01-09 ENCOUNTER — Ambulatory Visit: Payer: Medicare Other | Admitting: Internal Medicine

## 2023-01-18 DIAGNOSIS — L110 Acquired keratosis follicularis: Secondary | ICD-10-CM | POA: Diagnosis not present

## 2023-01-18 DIAGNOSIS — M79671 Pain in right foot: Secondary | ICD-10-CM | POA: Diagnosis not present

## 2023-01-18 DIAGNOSIS — M79675 Pain in left toe(s): Secondary | ICD-10-CM | POA: Diagnosis not present

## 2023-01-18 DIAGNOSIS — M79674 Pain in right toe(s): Secondary | ICD-10-CM | POA: Diagnosis not present

## 2023-01-18 DIAGNOSIS — M79672 Pain in left foot: Secondary | ICD-10-CM | POA: Diagnosis not present

## 2023-01-18 DIAGNOSIS — I739 Peripheral vascular disease, unspecified: Secondary | ICD-10-CM | POA: Diagnosis not present

## 2023-01-23 NOTE — Patient Instructions (Signed)

## 2023-01-23 NOTE — Progress Notes (Unsigned)
   Established Patient Office Visit   Subjective  Patient ID: Tonya Nelson, female    DOB: 1955/11/14  Age: 67 y.o. MRN: [id number]  No chief complaint on file.   She  has a past medical history of Asthma, Cellulitis, Endometrial polyp (12/2014), Essential hypertension, Pain in left upper arm (05/15/2019), PMB (postmenopausal bleeding) (11/18/2014), Porokeratosis, Superficial fungus infection of skin (06/04/2019), and Thickened endometrium (05/23/2017).  Patient complains of {pneum rfv:14244}. Patient describes symptoms of {pneumonia symptoms:12520}. Symptoms began {0-10:33138} {unit:11::"days"} ago and are {course:17::"unchanged"} since that time. Patient denies {pneumonia denies:14121}. Treatment thus far includes {pneumonia tx to date:14122} Past pulmonary history is significant for {resp history:412}     ROS    Objective:     There were no vitals taken for this visit. {Vitals History (Optional):23777}  Physical Exam   No results found for any visits on 01/24/23.  The 10-year ASCVD risk score (Arnett DK, et al., 2019) is: 10.4%    Assessment & Plan:  There are no diagnoses linked to this encounter.  No follow-ups on file.   Cruzita Lederer Newman Nip, FNP

## 2023-01-24 ENCOUNTER — Ambulatory Visit (INDEPENDENT_AMBULATORY_CARE_PROVIDER_SITE_OTHER): Payer: Medicare Other | Admitting: Family Medicine

## 2023-01-24 ENCOUNTER — Encounter: Payer: Self-pay | Admitting: Family Medicine

## 2023-01-24 VITALS — BP 157/78 | HR 69 | Ht 66.5 in | Wt 223.1 lb

## 2023-01-24 DIAGNOSIS — J069 Acute upper respiratory infection, unspecified: Secondary | ICD-10-CM | POA: Diagnosis not present

## 2023-01-24 MED ORDER — AZITHROMYCIN 250 MG PO TABS
ORAL_TABLET | ORAL | 0 refills | Status: DC
Start: 2023-01-24 — End: 2023-02-14

## 2023-01-24 MED ORDER — ALBUTEROL SULFATE HFA 108 (90 BASE) MCG/ACT IN AERS
2.0000 | INHALATION_SPRAY | Freq: Four times a day (QID) | RESPIRATORY_TRACT | 6 refills | Status: AC | PRN
Start: 2023-01-24 — End: ?

## 2023-01-24 MED ORDER — BENZONATATE 200 MG PO CAPS: 200.0000 mg | ORAL_CAPSULE | Freq: Two times a day (BID) | ORAL | 0 refills | Status: DC | PRN

## 2023-01-24 NOTE — Assessment & Plan Note (Signed)
Azithromycin 250 mg twice daily x 5 days, Benzonatate 200 mg PRN, Albuterol inhaler for SOB Advise patient to rest to support your body's recovery. Stay hydrated by drinking water, tea, or broth. Using a humidifier can help soothe throat irritation and ease nasal congestion. For fever or pain, acetaminophen (Tylenol) is recommended. To relieve other symptoms, try saline nasal sprays, throat lozenges, or gargling with saltwater. Focus on eating light, healthy meals like fruits and vegetables to keep your strength up. Practice good hygiene by washing your hands frequently and covering your mouth when coughing or sneezing.Follow-up for worsening or persistent symptoms. Patient verbalizes understanding regarding plan of care and all questions answered

## 2023-02-14 ENCOUNTER — Encounter: Payer: Self-pay | Admitting: Internal Medicine

## 2023-02-14 ENCOUNTER — Ambulatory Visit (INDEPENDENT_AMBULATORY_CARE_PROVIDER_SITE_OTHER): Payer: Medicare Other | Admitting: Internal Medicine

## 2023-02-14 VITALS — BP 128/60 | HR 63 | Ht >= 80 in | Wt 221.0 lb

## 2023-02-14 DIAGNOSIS — E66811 Obesity, class 1: Secondary | ICD-10-CM

## 2023-02-14 DIAGNOSIS — I10 Essential (primary) hypertension: Secondary | ICD-10-CM | POA: Diagnosis not present

## 2023-02-14 DIAGNOSIS — M810 Age-related osteoporosis without current pathological fracture: Secondary | ICD-10-CM | POA: Diagnosis not present

## 2023-02-14 DIAGNOSIS — Z1211 Encounter for screening for malignant neoplasm of colon: Secondary | ICD-10-CM

## 2023-02-14 DIAGNOSIS — E7800 Pure hypercholesterolemia, unspecified: Secondary | ICD-10-CM | POA: Diagnosis not present

## 2023-02-14 DIAGNOSIS — J45909 Unspecified asthma, uncomplicated: Secondary | ICD-10-CM | POA: Diagnosis not present

## 2023-02-14 DIAGNOSIS — R7303 Prediabetes: Secondary | ICD-10-CM | POA: Diagnosis not present

## 2023-02-14 DIAGNOSIS — Z1212 Encounter for screening for malignant neoplasm of rectum: Secondary | ICD-10-CM

## 2023-02-14 MED ORDER — BISOPROLOL-HYDROCHLOROTHIAZIDE 5-6.25 MG PO TABS: 1.0000 | ORAL_TABLET | Freq: Every day | ORAL | 3 refills | Status: DC

## 2023-02-14 MED ORDER — AIRSUPRA 90-80 MCG/ACT IN AERO
1.0000 | INHALATION_SPRAY | RESPIRATORY_TRACT | 3 refills | Status: DC | PRN
Start: 2023-02-14 — End: 2023-02-14

## 2023-02-14 NOTE — Assessment & Plan Note (Signed)
Asymptomatic currently.  Pulmonary exam is unremarkable.  Symptoms remain adequately controlled with as needed use of albuterol, which she requires infrequently.

## 2023-02-14 NOTE — Assessment & Plan Note (Signed)
Remains adequately controlled with bisoprolol-HCTZ 5-6.25 mg daily.  No medication changes are indicated today.

## 2023-02-14 NOTE — Progress Notes (Signed)
 Established Patient Office Visit  Subjective   Patient ID: Tonya Nelson, female    DOB: 1955-09-03  Age: 67 y.o. MRN: [id number]  Chief Complaint  Patient presents with   Follow-up   Ms. Leggette returns to care today for routine follow-up.  She was last evaluated by me on 4/12.  No medication changes were made at that time and 68-month follow-up was arranged.  In the interim, she presented to [facility] for an acute visit earlier this month endorsing URI symptoms.  There have otherwise been no acute interval events. Ms. Mcbean reports feeling well today.  She is asymptomatic and has no acute concerns to discuss.  Past Medical History:  Diagnosis Date   Asthma    Cellulitis    in toe on left foot   Endometrial polyp 12/2014   Essential hypertension    Pain in left upper arm 05/15/2019   PMB (postmenopausal bleeding) 11/18/2014   Porokeratosis    Superficial fungus infection of skin 06/04/2019   Thickened endometrium 05/23/2017   Will get endo bx   Past Surgical History:  Procedure Laterality Date   WISDOM TOOTH EXTRACTION     Social History   Tobacco Use   Smoking status: Never   Smokeless tobacco: Never  Vaping Use   Vaping status: Never Used  Substance Use Topics   Alcohol use: No   Drug use: No   Family History  Problem Relation Age of Onset   Osteoporosis Mother    Stomach cancer Mother    Cancer Mother    Bone cancer Father    Cancer Father    Parkinson's disease Brother    Alzheimer's disease Brother    Diabetes Maternal Grandmother    Glaucoma Maternal Grandfather    Allergies  Allergen Reactions   Penicillins Rash   Review of Systems  Constitutional:  Negative for chills and fever.  HENT:  Negative for sore throat.   Respiratory:  Negative for cough and shortness of breath.   Cardiovascular:  Negative for chest pain, palpitations and leg swelling.  Gastrointestinal:  Negative for abdominal pain, blood in stool, constipation, diarrhea, nausea and vomiting.   Genitourinary:  Negative for dysuria and hematuria.  Musculoskeletal:  Negative for myalgias.  Skin:  Negative for itching and rash.  Neurological:  Negative for dizziness and headaches.  Psychiatric/Behavioral:  Negative for depression and suicidal ideas.      Objective:     BP 128/60   Pulse 63   Ht (!) 8' 1 (2.464 m)   Wt 221 lb (100.2 kg)   SpO2 94%   BMI 16.51 kg/m  BP Readings from Last 3 Encounters:  02/14/23 128/60  01/24/23 (!) 157/78  05/27/22 124/61   Physical Exam Vitals reviewed.  Constitutional:      General: She is not in acute distress.    Appearance: Normal appearance. She is obese. She is not toxic-appearing.  HENT:     Head: Normocephalic and atraumatic.     Right Ear: External ear normal.     Left Ear: External ear normal.     Nose: Nose normal. No congestion or rhinorrhea.     Mouth/Throat:     Mouth: Mucous membranes are moist.     Pharynx: Oropharynx is clear. No oropharyngeal exudate or posterior oropharyngeal erythema.  Eyes:     General: No scleral icterus.    Extraocular Movements: Extraocular movements intact.     Conjunctiva/sclera: Conjunctivae normal.     Pupils: Pupils are equal,  round, and reactive to light.  Cardiovascular:     Rate and Rhythm: Normal rate and regular rhythm.     Pulses: Normal pulses.     Heart sounds: Normal heart sounds. No murmur heard.    No friction rub. No gallop.  Pulmonary:     Effort: Pulmonary effort is normal.     Breath sounds: Normal breath sounds. No wheezing, rhonchi or rales.  Abdominal:     General: Abdomen is flat. Bowel sounds are normal. There is no distension.     Palpations: Abdomen is soft.     Tenderness: There is no abdominal tenderness.  Musculoskeletal:        General: No swelling. Normal range of motion.     Cervical back: Normal range of motion.     Right lower leg: No edema.     Left lower leg: No edema.  Lymphadenopathy:     Cervical: No cervical adenopathy.  Skin:     General: Skin is warm and dry.     Capillary Refill: Capillary refill takes less than 2 seconds.     Coloration: Skin is not jaundiced.  Neurological:     General: No focal deficit present.     Mental Status: She is alert and oriented to person, place, and time.  Psychiatric:        Mood and Affect: Mood normal.        Behavior: Behavior normal.   Last CBC Lab Results  Component Value Date   WBC 8.5 11/18/2021   HGB 14.0 11/18/2021   HCT 42.4 11/18/2021   MCV 87 11/18/2021   MCH 28.7 11/18/2021   RDW 13.1 11/18/2021   PLT 246 11/18/2021   Last metabolic panel Lab Results  Component Value Date   GLUCOSE 104 (H) 11/18/2021   NA 141 11/18/2021   K 3.7 11/18/2021   CL 101 11/18/2021   CO2 24 11/18/2021   BUN 11 11/18/2021   CREATININE 0.74 11/18/2021   EGFR 89 11/18/2021   CALCIUM 9.6 11/18/2021   PROT 7.3 11/18/2021   ALBUMIN 4.4 11/18/2021   LABGLOB 2.9 11/18/2021   AGRATIO 1.5 11/18/2021   BILITOT 0.3 11/18/2021   ALKPHOS 44 11/18/2021   AST 35 11/18/2021   ALT 35 (H) 11/18/2021   Last lipids Lab Results  Component Value Date   CHOL 173 11/18/2021   HDL 34 (L) 11/18/2021   LDLCALC 93 11/18/2021   TRIG 274 (H) 11/18/2021   CHOLHDL 5.1 (H) 11/18/2021   Last hemoglobin A1c Lab Results  Component Value Date   HGBA1C 5.1 01/20/2021   Last thyroid  functions Lab Results  Component Value Date   TSH 1.160 11/18/2021   Last vitamin D  Lab Results  Component Value Date   VD25OH 23.0 (L) 11/18/2021   The 10-year ASCVD risk score (Arnett DK, et al., 2019) is: 11%    Assessment & Plan:   Problem List Items Addressed This Visit       Essential hypertension - Primary   Remains adequately controlled with bisoprolol -HCTZ 5-6.25 mg daily.  No medication changes are indicated today.      Asthma   Asymptomatic currently.  Pulmonary exam is unremarkable.  Symptoms remain adequately controlled with as needed use of albuterol , which she requires infrequently.       Screening for colorectal cancer   Cologuard reordered today      Prediabetes   Noted on previous labs.  Repeat A1c ordered today.       Return  in about 6 months (around 08/14/2023).    Manus FORBES Fireman, MD

## 2023-02-14 NOTE — Patient Instructions (Signed)
 It was a pleasure to see you today.  Thank you for giving us  the opportunity to be involved in your care.  Below is a brief recap of your visit and next steps.  We will plan to see you again in 6 months.  Summary No medication changes today. Blood pressure medication refilled Repeat labs ordered Follow up in 6 months

## 2023-02-14 NOTE — Assessment & Plan Note (Signed)
Cologuard reordered today.

## 2023-02-14 NOTE — Assessment & Plan Note (Signed)
Noted on previous labs. -Repeat A1c ordered today

## 2023-02-15 LAB — CMP14+EGFR
ALT: 27 [IU]/L (ref 0–32)
AST: 32 [IU]/L (ref 0–40)
Albumin: 4.5 g/dL (ref 3.9–4.9)
Alkaline Phosphatase: 48 [IU]/L (ref 44–121)
BUN/Creatinine Ratio: 15 (ref 12–28)
BUN: 13 mg/dL (ref 8–27)
Bilirubin Total: 0.4 mg/dL (ref 0.0–1.2)
CO2: 22 mmol/L (ref 20–29)
Calcium: 9.6 mg/dL (ref 8.7–10.3)
Chloride: 102 mmol/L (ref 96–106)
Creatinine, Ser: 0.84 mg/dL (ref 0.57–1.00)
Globulin, Total: 2.7 g/dL (ref 1.5–4.5)
Glucose: 90 mg/dL (ref 70–99)
Potassium: 3.7 mmol/L (ref 3.5–5.2)
Sodium: 140 mmol/L (ref 134–144)
Total Protein: 7.2 g/dL (ref 6.0–8.5)
eGFR: 76 mL/min/{1.73_m2} (ref >59)

## 2023-02-15 LAB — CBC WITH DIFFERENTIAL/PLATELET
Basophils Absolute: 0 10*3/uL (ref 0.0–0.2)
Basos: 1 %
EOS (ABSOLUTE): 0.3 10*3/uL (ref 0.0–0.4)
Eos: 3 %
Hematocrit: 41.5 % (ref 34.0–46.6)
Hemoglobin: 13.5 g/dL (ref 11.1–15.9)
Immature Grans (Abs): 0 10*3/uL (ref 0.0–0.1)
Immature Granulocytes: 0 %
Lymphocytes Absolute: 2.7 10*3/uL (ref 0.7–3.1)
Lymphs: 35 %
MCH: 28.6 pg (ref 26.6–33.0)
MCHC: 32.5 g/dL (ref 31.5–35.7)
MCV: 88 fL (ref 79–97)
Monocytes Absolute: 0.6 10*3/uL (ref 0.1–0.9)
Monocytes: 8 %
Neutrophils Absolute: 4.1 10*3/uL (ref 1.4–7.0)
Neutrophils: 53 %
Platelets: 253 10*3/uL (ref 150–450)
RBC: 4.72 x10E6/uL (ref 3.77–5.28)
RDW: 12.9 % (ref 11.7–15.4)
WBC: 7.8 10*3/uL (ref 3.4–10.8)

## 2023-02-15 LAB — LIPID PANEL
Chol/HDL Ratio: 5.5 {ratio} — ABNORMAL HIGH (ref 0.0–4.4)
Cholesterol, Total: 180 mg/dL (ref 100–199)
HDL: 33 mg/dL — ABNORMAL LOW (ref >39)
LDL Chol Calc (NIH): 97 mg/dL (ref 0–99)
Triglycerides: 297 mg/dL — ABNORMAL HIGH (ref 0–149)
VLDL Cholesterol Cal: 50 mg/dL — ABNORMAL HIGH (ref 5–40)

## 2023-02-15 LAB — HEMOGLOBIN A1C
Est. average glucose Bld gHb Est-mCnc: 114 mg/dL
Hgb A1c MFr Bld: 5.6 % (ref 4.8–5.6)

## 2023-02-15 LAB — VITAMIN D 25 HYDROXY (VIT D DEFICIENCY, FRACTURES): Vit D, 25-Hydroxy: 25.6 ng/mL — ABNORMAL LOW (ref 30.0–100.0)

## 2023-02-15 LAB — TSH+FREE T4
Free T4: 1.1 ng/dL (ref 0.82–1.77)
TSH: 1.64 u[IU]/mL (ref 0.450–4.500)

## 2023-02-15 LAB — B12 AND FOLATE PANEL
Folate: 5.6 ng/mL (ref >3.0)
Vitamin B-12: 248 pg/mL (ref 232–1245)

## 2023-02-16 ENCOUNTER — Encounter: Payer: Self-pay | Admitting: Internal Medicine

## 2023-02-16 ENCOUNTER — Telehealth: Payer: Self-pay | Admitting: Internal Medicine

## 2023-02-16 NOTE — Telephone Encounter (Signed)
 Copied from CRM [id number]. Topic: Clinical - Prescription Issue >> Feb 14, 2023 10:16 AM Carlatta H wrote: Reason for CRM: Pharmacy called stating albuterol  (VENTOLIN  HFA) 108 (90 Base) MCG/ACT inhaler [[id number]] needs prior authorization and also cannot say PRN//It needs to have how many puffs per however many hours//

## 2023-02-28 ENCOUNTER — Ambulatory Visit (INDEPENDENT_AMBULATORY_CARE_PROVIDER_SITE_OTHER): Payer: Medicare Other

## 2023-02-28 ENCOUNTER — Telehealth: Payer: Self-pay | Admitting: Internal Medicine

## 2023-02-28 VITALS — Ht 67.0 in | Wt 228.0 lb

## 2023-02-28 DIAGNOSIS — Z780 Asymptomatic menopausal state: Secondary | ICD-10-CM | POA: Diagnosis not present

## 2023-02-28 DIAGNOSIS — Z0000 Encounter for general adult medical examination without abnormal findings: Secondary | ICD-10-CM | POA: Diagnosis not present

## 2023-02-28 DIAGNOSIS — Z0100 Encounter for examination of eyes and vision without abnormal findings: Secondary | ICD-10-CM

## 2023-02-28 DIAGNOSIS — Z1231 Encounter for screening mammogram for malignant neoplasm of breast: Secondary | ICD-10-CM

## 2023-02-28 DIAGNOSIS — Z2821 Immunization not carried out because of patient refusal: Secondary | ICD-10-CM

## 2023-02-28 NOTE — Telephone Encounter (Signed)
Copied from CRM [id number]. Topic: Clinical - Lab/Test Results >> Feb 28, 2023  1:36 PM Lorretta Harp wrote: Reason for CRM: Patient called to get the results of her lab results, and wanted to speak to someone in the office.

## 2023-02-28 NOTE — Progress Notes (Signed)
 Because this visit was a virtual/telehealth visit,  certain criteria was not obtained, such a blood pressure, CBG if applicable, and timed get up and go. Any medications not marked as taking were not mentioned during the medication reconciliation part of the visit. Any vitals not documented were not able to be obtained due to this being a telehealth visit or patient was unable to self-report a recent blood pressure reading due to a lack of equipment at home via telehealth. Vitals that have been documented are verbally provided by the patient.  Interactive audio and video telecommunications were attempted between this provider and patient, however failed, due to patient having technical difficulties OR patient did not have access to video capability.  We continued and completed visit with audio only.  Subjective:   Tonya Nelson is a 68 y.o. female who presents for Medicare Annual (Subsequent) preventive examination.  Visit Complete: Virtual I connected with  Tonya Nelson on 02/28/23 by a audio enabled telemedicine application and verified that I am speaking with the correct person using two identifiers.  Patient Location: Home  Provider Location: Home Office  I discussed the limitations of evaluation and management by telemedicine. The patient expressed understanding and agreed to proceed.  Vital Signs: Because this visit was a virtual/telehealth visit, some criteria may be missing or patient reported. Any vitals not documented were not able to be obtained and vitals that have been documented are patient reported.  Patient Medicare AWV questionnaire was completed by the patient on na; I have confirmed that all information answered by patient is correct and no changes since this date.  Cardiac Risk Factors include: advanced age (>20men, >11 women);hypertension;sedentary lifestyle;obesity (BMI >30kg/m2)     Objective:    Today's Vitals   02/28/23 1314 02/28/23 1316  Weight: 228 lb (103.4  kg)   Height: 5' 7 (1.702 m)   PainSc:  0-No pain   Body mass index is 35.71 kg/m.     02/28/2023    1:14 PM 02/02/2022    8:35 AM 05/20/2019    9:50 AM  Advanced Directives  Does Patient Have a Medical Advance Directive? No No No  Would patient like information on creating a medical advance directive? No - Patient declined No - Patient declined No - Patient declined    Current Medications (verified) Outpatient Encounter Medications as of 02/28/2023  Medication Sig   albuterol  (VENTOLIN  HFA) 108 (90 Base) MCG/ACT inhaler Inhale 2 puffs into the lungs every 6 (six) hours as needed for wheezing or shortness of breath.   ammonium lactate  (AMLACTIN) 12 % cream Apply topically 2 (two) times daily as needed.   benzonatate  (TESSALON ) 200 MG capsule Take 1 capsule (200 mg total) by mouth 2 (two) times daily as needed for cough.   bisoprolol -hydrochlorothiazide  (ZIAC ) 5-6.25 MG tablet Take 1 tablet by mouth daily.   cholecalciferol (VITAMIN D ) 1000 units tablet Take 2,000 Units by mouth daily.   furosemide  (LASIX ) 40 MG tablet Take 40 mg by mouth daily.   nystatin  (MYCOSTATIN /NYSTOP ) powder Apply 1 Application topically 3 (three) times daily.   No facility-administered encounter medications on file as of 02/28/2023.    Allergies (verified) Penicillins   History: Past Medical History:  Diagnosis Date   Asthma    Cellulitis    in toe on left foot   Endometrial polyp 12/2014   Essential hypertension    Pain in left upper arm 05/15/2019   PMB (postmenopausal bleeding) 11/18/2014   Porokeratosis    Superficial  fungus infection of skin 06/04/2019   Thickened endometrium 05/23/2017   Will get endo bx   Past Surgical History:  Procedure Laterality Date   WISDOM TOOTH EXTRACTION     Family History  Problem Relation Age of Onset   Osteoporosis Mother    Stomach cancer Mother    Cancer Mother    Bone cancer Father    Cancer Father    Parkinson's disease Brother    Alzheimer's disease  Brother    Diabetes Maternal Grandmother    Glaucoma Maternal Grandfather    Social History   Socioeconomic History   Marital status: Widowed    Spouse name: Not on file   Number of children: 1   Years of education: Not on file   Highest education level: Some college, no degree  Occupational History   Occupation: Retired  Tobacco Use   Smoking status: Never   Smokeless tobacco: Never  Vaping Use   Vaping status: Never Used  Substance and Sexual Activity   Alcohol use: No   Drug use: No   Sexual activity: Not Currently    Birth control/protection: Post-menopausal  Other Topics Concern   Not on file  Social History Narrative   Lives with Son      Enjoys: music-concert, reading, being outside      Diet: eats all food groups   Caffeine: half and half tea-decaff usually   Water: 6-8 cups daily      Wears seat belt   Does not use phone while driving    Smoke detectors at home   Right handed    Social Drivers of Health   Financial Resource Strain: Low Risk  (02/28/2023)   Overall Financial Resource Strain (CARDIA)    Difficulty of Paying Living Expenses: Not hard at all  Food Insecurity: No Food Insecurity (02/28/2023)   Hunger Vital Sign    Worried About Running Out of Food in the Last Year: Never true    Ran Out of Food in the Last Year: Never true  Transportation Needs: No Transportation Needs (02/28/2023)   PRAPARE - Administrator, Civil Service (Medical): No    Lack of Transportation (Non-Medical): No  Physical Activity: Insufficiently Active (02/28/2023)   Exercise Vital Sign    Days of Exercise per Week: 3 days    Minutes of Exercise per Session: 30 min  Stress: No Stress Concern Present (02/28/2023)   [employer] of Occupational Health - Occupational Stress Questionnaire    Feeling of Stress : Not at all  Social Connections: Moderately Isolated (02/28/2023)   Social Connection and Isolation Panel [NHANES]    Frequency of Communication with  Friends and Family: More than three times a week    Frequency of Social Gatherings with Friends and Family: More than three times a week    Attends Religious Services: More than 4 times per year    Active Member of [employer] or Organizations: No    Attends Banker Meetings: Never    Marital Status: Widowed    Tobacco Counseling Counseling given: Not Answered   Clinical Intake:  Pre-visit preparation completed: Yes  Pain : No/denies pain Pain Score: 0-No pain     BMI - recorded: 35.71 Nutritional Status: BMI > 30  Obese Nutritional Risks: None Diabetes: No  How often do you need to have someone help you when you read instructions, pamphlets, or other written materials from your doctor or pharmacy?: 1 - Never  Interpreter Needed?: No  Information entered by :: Stefano ORN CMA   Activities of Daily Living    02/28/2023    1:18 PM  In your present state of health, do you have any difficulty performing the following activities:  Hearing? 0  Vision? 0  Difficulty concentrating or making decisions? 0  Walking or climbing stairs? 0  Dressing or bathing? 0  Doing errands, shopping? 0  Preparing Food and eating ? N  Using the Toilet? N  In the past six months, have you accidently leaked urine? N  Do you have problems with loss of bowel control? N  Managing your Medications? N  Managing your Finances? N  Housekeeping or managing your Housekeeping? N    Patient Care Team: Melvenia Manus BRAVO, MD as PCP - General (Internal Medicine)  Indicate any recent Medical Services you may have received from other than [facility] providers in the past year (date may be approximate).     Assessment:   This is a routine wellness examination for Tonya Nelson.  Hearing/Vision screen Hearing Screening - Comments:: Patient denies any hearing difficulties.   Vision Screening - Comments:: Referral placed for patient to see Dr. Octavia in [place] again   Goals Addressed             This  Visit's Progress    Patient Stated       To stay healthy       Depression Screen    02/28/2023    1:19 PM 05/27/2022    1:11 PM 04/08/2022    3:41 PM 02/02/2022    8:35 AM 11/18/2021    1:22 PM 02/01/2021    8:54 AM 12/22/2020   10:53 AM  PHQ 2/9 Scores  PHQ - 2 Score 0 0 0 0 0 0 0  PHQ- 9 Score 0 0 0        Fall Risk    02/28/2023    1:18 PM 05/27/2022    1:11 PM 04/08/2022    3:41 PM 02/02/2022    8:35 AM 11/18/2021    1:21 PM  Fall Risk   Falls in the past year? 0 0 0 0 0  Number falls in past yr: 0 0 0 0 0  Injury with Fall? 0 0 0 0 0  Risk for fall due to : No Fall Risks No Fall Risks No Fall Risks No Fall Risks No Fall Risks  Follow up Falls prevention discussed Falls evaluation completed Falls evaluation completed Falls evaluation completed Falls evaluation completed    MEDICARE RISK AT HOME: Medicare Risk at Home Any stairs in or around the home?: No If so, are there any without handrails?: No Home free of loose throw rugs in walkways, pet beds, electrical cords, etc?: Yes Adequate lighting in your home to reduce risk of falls?: Yes Life alert?: No Use of a cane, walker or w/c?: No Grab bars in the bathroom?: Yes Shower chair or bench in shower?: No Elevated toilet seat or a handicapped toilet?: Yes  TIMED UP AND GO:  Was the test performed?  No    Cognitive Function:        02/28/2023    1:17 PM 02/02/2022    8:36 AM 07/15/2020    9:25 AM  6CIT Screen  What Year? 0 points 0 points 0 points  What month? 0 points 0 points 0 points  What time? 0 points 0 points 0 points  Count back from 20 0 points 0 points 0 points  Months in reverse 0  points 0 points 0 points  Repeat phrase 0 points 0 points 0 points  Total Score 0 points 0 points 0 points    Immunizations  There is no immunization history on file for this patient.  TDAP Status: Patient declined vaccine Due, Education has been provided regarding the importance of this vaccine. Advised may  receive this vaccine at local pharmacy or Health Dept. Aware to provide a copy of the vaccination record if obtained from local pharmacy or Health Dept. Verbalized acceptance and understanding.   Flu Vaccine status: Declined, Education has been provided regarding the importance of this vaccine but patient still declined. Advised may receive this vaccine at local pharmacy or Health Dept. Aware to provide a copy of the vaccination record if obtained from local pharmacy or Health Dept. Verbalized acceptance and understanding.  Pneumococcal vaccine status: Declined,  Education has been provided regarding the importance of this vaccine but patient still declined. Advised may receive this vaccine at local pharmacy or Health Dept. Aware to provide a copy of the vaccination record if obtained from local pharmacy or Health Dept. Verbalized acceptance and understanding.   Covid-19 vaccine status: Declined, Education has been provided regarding the importance of this vaccine but patient still declined. Advised may receive this vaccine at local pharmacy or Health Dept.or vaccine clinic. Aware to provide a copy of the vaccination record if obtained from local pharmacy or Health Dept. Verbalized acceptance and understanding.  Qualifies for Shingles Vaccine? Shingrix Vaccine: Patient declined vaccine.  Due, Education has been provided regarding the importance of this vaccine. Advised may receive this vaccine at local pharmacy or Health Dept. Aware to provide a copy of the vaccination record if obtained from local pharmacy or Health Dept. Verbalized acceptance and understanding.  Screening Tests Health Maintenance  Topic Date Due   Medicare Annual Wellness (AWV)  02/03/2023   Zoster Vaccines- Shingrix (1 of 2) 04/24/2023 (Originally 11/02/2004)   Pneumonia Vaccine 36+ Years old (1 of 2 - PCV) 05/10/2023 (Originally 11/02/1961)   INFLUENZA VACCINE  05/15/2023 (Originally 09/15/2022)   DTaP/Tdap/Td (1 - Tdap)  01/24/2024 (Originally 11/02/1973)   Colonoscopy  01/24/2024 (Originally 11/03/1999)   MAMMOGRAM  11/25/2023   DEXA SCAN  Completed   Hepatitis C Screening  Completed   HPV VACCINES  Aged Out   COVID-19 Vaccine  Discontinued    Health Maintenance  Health Maintenance Due  Topic Date Due   Medicare Annual Wellness (AWV)  02/03/2023    Colorectal Cancer Screening: Patient declined colorectal cancer screening  Mammogram status: Ordered 02/28/2023. Pt provided with contact info and advised to call to schedule appt.   Bone Density status: Ordered 02/28/2023. Pt provided with contact info and advised to call to schedule appt.  Lung Cancer Screening: (Low Dose CT Chest recommended if Age 67-80 years, 20 pack-year currently smoking OR have quit w/in 15years.) does not qualify.   Lung Cancer Screening Referral: na  Additional Screening:  Hepatitis C Screening: does not qualify; Completed   Vision Screening: Recommended annual ophthalmology exams for early detection of glaucoma and other disorders of the eye. Is the patient up to date with their annual eye exam?  No  Who is the provider or what is the name of the office in which the patient attends annual eye exams? Referral placed for patient to re-establish with Dr. Octavia in [facility]  Dental Screening: Recommended annual dental exams for proper oral hygiene  Diabetic Foot Exam: na  Community Resource Referral / Chronic Care Management: CRR required  this visit?  No   CCM required this visit?  No     Plan:     I have personally reviewed and noted the following in the patient's chart:   Medical and social history Use of alcohol, tobacco or illicit drugs  Current medications and supplements including opioid prescriptions. Patient is not currently taking opioid prescriptions. Functional ability and status Nutritional status Physical activity Advanced directives List of other physicians Hospitalizations, surgeries, and ER  visits in previous 12 months Vitals Screenings to include cognitive, depression, and falls Referrals and appointments  In addition, I have reviewed and discussed with patient certain preventive protocols, quality metrics, and best practice recommendations. A written personalized care plan for preventive services as well as general preventive health recommendations were provided to patient.     [facility] LABOR Sian Joles, CMA   02/28/2023   After Visit Summary: (MyChart) Due to this being a telephonic visit, the after visit summary with patients personalized plan was offered to patient via MyChart   Nurse Notes: see routing comment

## 2023-02-28 NOTE — Telephone Encounter (Signed)
 Spoke to patient

## 2023-02-28 NOTE — Patient Instructions (Signed)
 Tonya Nelson , Thank you for taking time to come for your Medicare Wellness Visit. I appreciate your ongoing commitment to your health goals. Please review the following plan we discussed and let me know if I can assist you in the future.   Referrals/Orders/Follow-Ups/Clinician Recommendations:  Next Medicare Annual Wellness Visit: March 04, 2024 at 12:30 pm telephone visit  You have been referred to [facility] for a complete eye exam. If you haven't heard from them in a few days, please call them to schedule your appointment.  [facility] [address] [place] [address] Phone: [phone]  You have an order for:  []   2D Mammogram  [x]   3D Mammogram  [x]   Bone Density   []   Lung Cancer Screening  Please call for appointment:   [facility] [address] [place], [place] [address] [phone]  Make sure to wear two-piece clothing.  No lotions powders or deodorants the day of the appointment Make sure to bring picture ID and insurance card.  Bring list of medications you are currently taking including any supplements.   Schedule your [facility] screening mammogram through MyChart!   Log into your MyChart account.  Go to 'Visit' (or 'Appointments' if on mobile App) --> Schedule an Appointment  Under 'Select a Reason for Visit' choose the Mammogram Screening option.  Complete the pre-visit questions and select the time and place that best fits your schedule.    This is a list of the screening recommended for you and due dates:  Health Maintenance  Topic Date Due   DEXA scan (bone density measurement)  05/02/2021   Mammogram  11/25/2022   Zoster (Shingles) Vaccine (1 of 2) 04/24/2023*   Pneumonia Vaccine (1 of 2 - PCV) 05/10/2023*   Flu Shot  05/15/2023*   DTaP/Tdap/Td vaccine (1 - Tdap) 01/24/2024*   Colon Cancer Screening  01/24/2024*   Medicare Annual Wellness Visit  02/28/2024   Hepatitis C Screening  Completed    HPV Vaccine  Aged Out   COVID-19 Vaccine  Discontinued  *Topic was postponed. The date shown is not the original due date.    Advanced directives: (Declined) Advance directive discussed with you today. Even though you declined this today, please call our office should you change your mind, and we can give you the proper paperwork for you to fill out.  Next Medicare Annual Wellness Visit scheduled for next year: yes  Preventive Care 17 Years and Older, Female Preventive care refers to lifestyle choices and visits with your health care provider that can promote health and wellness. Preventive care visits are also called wellness exams. What can I expect for my preventive care visit? Counseling Your health care provider may ask you questions about your: Medical history, including: Past medical problems. Family medical history. Pregnancy and menstrual history. History of falls. Current health, including: Memory and ability to understand (cognition). Emotional well-being. Home life and relationship well-being. Sexual activity and sexual health. Lifestyle, including: Alcohol, nicotine or tobacco, and drug use. Access to firearms. Diet, exercise, and sleep habits. Work and work astronomer. Sunscreen use. Safety issues such as seatbelt and bike helmet use. Physical exam Your health care provider will check your: Height and weight. These may be used to calculate your BMI (body mass index). BMI is a measurement that tells if you are at a healthy weight. Waist circumference. This measures the distance around your waistline. This measurement also tells if  you are at a healthy weight and may help predict your risk of certain diseases, such as type 2 diabetes and high blood pressure. Heart rate and blood pressure. Body temperature. Skin for abnormal spots. What immunizations do I need?  Vaccines are usually given at various ages, according to a schedule. Your health care provider will  recommend vaccines for you based on your age, medical history, and lifestyle or other factors, such as travel or where you work. What tests do I need? Screening Your health care provider may recommend screening tests for certain conditions. This may include: Lipid and cholesterol levels. Hepatitis C test. Hepatitis B test. HIV (human immunodeficiency virus) test. STI (sexually transmitted infection) testing, if you are at risk. Lung cancer screening. Colorectal cancer screening. Diabetes screening. This is done by checking your blood sugar (glucose) after you have not eaten for a while (fasting). Mammogram. Talk with your health care provider about how often you should have regular mammograms. BRCA-related cancer screening. This may be done if you have a family history of breast, ovarian, tubal, or peritoneal cancers. Bone density scan. This is done to screen for osteoporosis. Talk with your health care provider about your test results, treatment options, and if necessary, the need for more tests. Follow these instructions at home: Eating and drinking  Eat a diet that includes fresh fruits and vegetables, whole grains, lean protein, and low-fat dairy products. Limit your intake of foods with high amounts of sugar, saturated fats, and salt. Take vitamin and mineral supplements as recommended by your health care provider. Do not drink alcohol if your health care provider tells you not to drink. If you drink alcohol: Limit how much you have to 0-1 drink a day. Know how much alcohol is in your drink. In the [place] one drink equals one 12 oz bottle of beer (355 mL), one 5 oz glass of wine (148 mL), or one 1 oz glass of hard liquor (44 mL). Lifestyle Brush your teeth every morning and night with fluoride toothpaste. Floss one time each day. Exercise for at least 30 minutes 5 or more days each week. Do not use any products that contain nicotine or tobacco. These products include cigarettes,  chewing tobacco, and vaping devices, such as e-cigarettes. If you need help quitting, ask your health care provider. Do not use drugs. If you are sexually active, practice safe sex. Use a condom or other form of protection in order to prevent STIs. Take aspirin only as told by your health care provider. Make sure that you understand how much to take and what form to take. Work with your health care provider to find out whether it is safe and beneficial for you to take aspirin daily. Ask your health care provider if you need to take a cholesterol-lowering medicine (statin). Find healthy ways to manage stress, such as: Meditation, yoga, or listening to music. Journaling. Talking to a trusted person. Spending time with friends and family. Minimize exposure to UV radiation to reduce your risk of skin cancer. Safety Always wear your seat belt while driving or riding in a vehicle. Do not drive: If you have been drinking alcohol. Do not ride with someone who has been drinking. When you are tired or distracted. While texting. If you have been using any mind-altering substances or drugs. Wear a helmet and other protective equipment during sports activities. If you have firearms in your house, make sure you follow all gun safety procedures. What's next? Visit your health care provider  once a year for an annual wellness visit. Ask your health care provider how often you should have your eyes and teeth checked. Stay up to date on all vaccines. This information is not intended to replace advice given to you by your health care provider. Make sure you discuss any questions you have with your health care provider. Document Revised: 07/29/2020 Document Reviewed: 07/29/2020 Elsevier Patient Education  2024 [employer].   Understanding Your Risk for Falls Millions of people have serious injuries from falls each year. It is important to understand your risk of falling. Talk with your health care  provider about your risk and what you can do to lower it. If you do have a serious fall, make sure to tell your provider. Falling once raises your risk of falling again. How can falls affect me? Serious injuries from falls are common. These include: Broken bones, such as hip fractures. Head injuries, such as traumatic brain injuries (TBI) or concussions. A fear of falling can cause you to avoid activities and stay at home. This can make your muscles weaker and raise your risk for a fall. What can increase my risk? There are a number of risk factors that increase your risk for falling. The more risk factors you have, the higher your risk of falling. Serious injuries from a fall happen most often to people who are older than 69 years old. Teenagers and young adults ages 22-29 are also at higher risk. Common risk factors include: Weakness in the lower body. Being generally weak or confused due to long-term (chronic) illness. Dizziness or balance problems. Poor vision. Medicines that cause dizziness or drowsiness. These may include: Medicines for your blood pressure, heart, anxiety, insomnia, or swelling (edema). Pain medicines. Muscle relaxants. Other risk factors include: Drinking alcohol. Having had a fall in the past. Having foot pain or wearing improper footwear. Working at a dangerous job. Having any of the following in your home: Tripping hazards, such as floor clutter or loose rugs. Poor lighting. Pets. Having dementia or memory loss. What actions can I take to lower my risk of falling?     Physical activity Stay physically fit. Do strength and balance exercises. Consider taking a regular class to build strength and balance. Yoga and tai chi are good options. Vision Have your eyes checked every year and your prescription for glasses or contacts updated as needed. Shoes and walking aids Wear non-skid shoes. Wear shoes that have rubber soles and low heels. Do not wear high  heels. Do not walk around the house in socks or slippers. Use a cane or walker as told by your provider. Home safety Attach secure railings on both sides of your stairs. Install grab bars for your bathtub, shower, and toilet. Use a non-skid mat in your bathtub or shower. Attach bath mats securely with double-sided, non-slip rug tape. Use good lighting in all rooms. Keep a flashlight near your bed. Make sure there is a clear path from your bed to the bathroom. Use night-lights. Do not use throw rugs. Make sure all carpeting is taped or tacked down securely. Remove all clutter from walkways and stairways, including extension cords. Repair uneven or broken steps and floors. Avoid walking on icy or slippery surfaces. Walk on the grass instead of on icy or slick sidewalks. Use ice melter to get rid of ice on walkways in the winter. Use a cordless phone. Questions to ask your health care provider Can you help me check my risk for a fall?  Do any of my medicines make me more likely to fall? Should I take a vitamin D  supplement? What exercises can I do to improve my strength and balance? Should I make an appointment to have my vision checked? Do I need a bone density test to check for weak bones (osteoporosis)? Would it help to use a cane or a walker? Where to find more information Centers for Disease Control and Prevention, STEADI: tonerpromos.no Community-Based Fall Prevention Programs: tonerpromos.no [employer] on Aging: baseringtones.pl Contact a health care provider if: You fall at home. You are afraid of falling at home. You feel weak, drowsy, or dizzy. This information is not intended to replace advice given to you by your health care provider. Make sure you discuss any questions you have with your health care provider. Document Revised: 10/04/2021 Document Reviewed: 10/04/2021 Elsevier Patient Education  2024 [employer].

## 2023-03-29 DIAGNOSIS — M79674 Pain in right toe(s): Secondary | ICD-10-CM | POA: Diagnosis not present

## 2023-03-29 DIAGNOSIS — M79672 Pain in left foot: Secondary | ICD-10-CM | POA: Diagnosis not present

## 2023-03-29 DIAGNOSIS — M79671 Pain in right foot: Secondary | ICD-10-CM | POA: Diagnosis not present

## 2023-03-29 DIAGNOSIS — I739 Peripheral vascular disease, unspecified: Secondary | ICD-10-CM | POA: Diagnosis not present

## 2023-03-29 DIAGNOSIS — M79675 Pain in left toe(s): Secondary | ICD-10-CM | POA: Diagnosis not present

## 2023-03-29 DIAGNOSIS — L110 Acquired keratosis follicularis: Secondary | ICD-10-CM | POA: Diagnosis not present

## 2023-06-07 DIAGNOSIS — L110 Acquired keratosis follicularis: Secondary | ICD-10-CM | POA: Diagnosis not present

## 2023-06-07 DIAGNOSIS — I739 Peripheral vascular disease, unspecified: Secondary | ICD-10-CM | POA: Diagnosis not present

## 2023-06-07 DIAGNOSIS — M79671 Pain in right foot: Secondary | ICD-10-CM | POA: Diagnosis not present

## 2023-06-07 DIAGNOSIS — M79674 Pain in right toe(s): Secondary | ICD-10-CM | POA: Diagnosis not present

## 2023-06-07 DIAGNOSIS — M79672 Pain in left foot: Secondary | ICD-10-CM | POA: Diagnosis not present

## 2023-06-07 DIAGNOSIS — M79675 Pain in left toe(s): Secondary | ICD-10-CM | POA: Diagnosis not present

## 2023-08-11 ENCOUNTER — Telehealth: Payer: Self-pay

## 2023-08-11 NOTE — Telephone Encounter (Unsigned)
 Copied from CRM [id number]. Topic: Appointments - Appointment Info/Confirmation >> Aug 11, 2023 11:54 AM Donee H wrote: Patient called to confirm upcoming office visit for July 1. Patient's doctor was Dr. Melvenia. Patient is schedule with Leita Longs. Patient want to know how long has Leita Longs been at office. Callback number [phone]

## 2023-08-14 ENCOUNTER — Ambulatory Visit: Payer: Medicare Other | Admitting: Internal Medicine

## 2023-08-15 ENCOUNTER — Ambulatory Visit

## 2023-08-15 VITALS — BP 128/67 | HR 60 | Ht 67.0 in | Wt 216.1 lb

## 2023-08-15 DIAGNOSIS — Z1231 Encounter for screening mammogram for malignant neoplasm of breast: Secondary | ICD-10-CM | POA: Diagnosis not present

## 2023-08-15 DIAGNOSIS — I10 Essential (primary) hypertension: Secondary | ICD-10-CM

## 2023-08-15 NOTE — Progress Notes (Signed)
 Established Patient Office Visit  Subjective   Patient ID: Tonya Nelson, female    DOB: 19-Jun-1955  Age: 68 y.o. MRN: [id number]  Chief Complaint  Patient presents with   Medical Management of Chronic Issues    6 month follow up    HPI  Patient Active Problem List   Diagnosis Date Noted   Upper respiratory infection 01/24/2023   Acute viral sinusitis 04/08/2022   Prediabetes 11/24/2021   Asthma 11/24/2021   Encounter to establish care 11/24/2021   LFTs abnormal 01/20/2021   Wheezing 07/30/2020   Obesity (BMI 30.0-34.9) 02/04/2020   Encounter for general adult medical examination with abnormal findings 01/14/2020   Elevated LDL cholesterol level 01/14/2020   Other insomnia 12/12/2019   Screening for colorectal cancer 06/04/2019   Hemorrhoids 06/04/2019   Elevated glucose 04/30/2019   Essential hypertension 04/18/2019   Osteoporosis 04/18/2019      ROS    Objective:     BP 128/67   Pulse 60   Ht 5' 7 (1.702 m)   Wt 216 lb 1.3 oz (98 kg)   SpO2 95%   BMI 33.84 kg/m  BP Readings from Last 3 Encounters:  08/15/23 128/67  02/14/23 128/60  01/24/23 (!) 157/78   Wt Readings from Last 3 Encounters:  08/15/23 216 lb 1.3 oz (98 kg)  02/28/23 228 lb (103.4 kg)  02/14/23 221 lb (100.2 kg)      Physical Exam Vitals and nursing note reviewed.  Constitutional:      Appearance: She is obese.  Eyes:     Extraocular Movements: Extraocular movements intact.     Pupils: Pupils are equal, round, and reactive to light.  Cardiovascular:     Rate and Rhythm: Normal rate and regular rhythm.  Pulmonary:     Effort: Pulmonary effort is normal.     Breath sounds: Normal breath sounds.  Musculoskeletal:     Cervical back: Normal range of motion and neck supple.  Neurological:     Mental Status: She is alert and oriented to person, place, and time.  Psychiatric:        Mood and Affect: Mood normal.        Thought Content: Thought content normal.      No results  found for any visits on 08/15/23.  Last CBC Lab Results  Component Value Date   WBC 7.8 02/14/2023   HGB 13.5 02/14/2023   HCT 41.5 02/14/2023   MCV 88 02/14/2023   MCH 28.6 02/14/2023   RDW 12.9 02/14/2023   PLT 253 02/14/2023   Last metabolic panel Lab Results  Component Value Date   GLUCOSE 90 02/14/2023   NA 140 02/14/2023   K 3.7 02/14/2023   CL 102 02/14/2023   CO2 22 02/14/2023   BUN 13 02/14/2023   CREATININE 0.84 02/14/2023   EGFR 76 02/14/2023   CALCIUM 9.6 02/14/2023   PROT 7.2 02/14/2023   ALBUMIN 4.5 02/14/2023   LABGLOB 2.7 02/14/2023   AGRATIO 1.5 11/18/2021   BILITOT 0.4 02/14/2023   ALKPHOS 48 02/14/2023   AST 32 02/14/2023   ALT 27 02/14/2023   Last lipids Lab Results  Component Value Date   CHOL 180 02/14/2023   HDL 33 (L) 02/14/2023   LDLCALC 97 02/14/2023   TRIG 297 (H) 02/14/2023   CHOLHDL 5.5 (H) 02/14/2023   Last hemoglobin A1c Lab Results  Component Value Date   HGBA1C 5.6 02/14/2023   Last vitamin D  Lab Results  Component Value  Date   VD25OH 25.6 (L) 02/14/2023      The 10-year ASCVD risk score (Arnett DK, et al., 2019) is: 11.3%    Assessment & Plan:   Problem List Items Addressed This Visit       Cardiovascular and Mediastinum   Essential hypertension - Primary   Remains adequately controlled with bisoprolol -HCTZ 5-6.25 mg daily.  No medication changes are indicated today.  Continue to work on weight loss with a healthy low sodium diet and regular exercise.  Recommend follow-up in 3 to 6 months or sooner if needed.      Other Visit Diagnoses       Encounter for screening mammogram for malignant neoplasm of breast       Relevant Orders   MM 3D SCREENING MAMMOGRAM BILATERAL BREAST       Return in about 6 months (around 02/15/2024) for chronic follow-up with PCP.    Leita Longs, FNP

## 2023-08-20 NOTE — Assessment & Plan Note (Signed)
 Remains adequately controlled with bisoprolol -HCTZ 5-6.25 mg daily.  No medication changes are indicated today.  Continue to work on weight loss with a healthy low sodium diet and regular exercise.  Recommend follow-up in 3 to 6 months or sooner if needed.

## 2023-08-23 DIAGNOSIS — M79671 Pain in right foot: Secondary | ICD-10-CM | POA: Diagnosis not present

## 2023-08-23 DIAGNOSIS — M79674 Pain in right toe(s): Secondary | ICD-10-CM | POA: Diagnosis not present

## 2023-08-23 DIAGNOSIS — L110 Acquired keratosis follicularis: Secondary | ICD-10-CM | POA: Diagnosis not present

## 2023-08-23 DIAGNOSIS — M79675 Pain in left toe(s): Secondary | ICD-10-CM | POA: Diagnosis not present

## 2023-08-23 DIAGNOSIS — M79672 Pain in left foot: Secondary | ICD-10-CM | POA: Diagnosis not present

## 2023-08-23 DIAGNOSIS — I739 Peripheral vascular disease, unspecified: Secondary | ICD-10-CM | POA: Diagnosis not present

## 2023-08-30 ENCOUNTER — Encounter (HOSPITAL_COMMUNITY): Payer: Self-pay

## 2023-08-30 ENCOUNTER — Ambulatory Visit (HOSPITAL_COMMUNITY)
Admission: RE | Admit: 2023-08-30 | Discharge: 2023-08-30 | Disposition: A | Discharge status: Home / Self Care | Source: Ambulatory Visit

## 2023-08-30 DIAGNOSIS — Z1231 Encounter for screening mammogram for malignant neoplasm of breast: Secondary | ICD-10-CM | POA: Insufficient documentation

## 2023-09-10 ENCOUNTER — Ambulatory Visit: Payer: Self-pay

## 2023-09-27 ENCOUNTER — Telehealth: Payer: Self-pay

## 2023-09-27 NOTE — Telephone Encounter (Signed)
 Pt advised she stated she dont know why she said labs, and that she was going to do those at the next visit

## 2023-09-27 NOTE — Telephone Encounter (Signed)
 Pt didn't mean to call about labs she forgot that they was going to do them at her next visit

## 2023-09-27 NOTE — Telephone Encounter (Signed)
 Already resolved.

## 2023-09-27 NOTE — Telephone Encounter (Signed)
 Copied from CRM [id number]. Topic: General - Other >> Sep 27, 2023 11:32 AM Debby BROCKS wrote: Reason for CRM: Patient returning a missed call. Contacted clinic and a nurse did try to reach out. However, they are currently assisting a patient and was advised that they will call back again later.

## 2023-09-27 NOTE — Telephone Encounter (Signed)
 Copied from CRM [id number]. Topic: Clinical - Lab/Test Results >> Sep 27, 2023  9:48 AM Zebedee SAUNDERS wrote: Reason for CRM: Pt states she had labs on last visit with PCP on 08/15/2023. Pt would like a call back at [phone].

## 2023-10-30 ENCOUNTER — Ambulatory Visit: Payer: Self-pay

## 2023-10-30 NOTE — Telephone Encounter (Signed)
 FYI Only or Action Required?: FYI only for provider.  Patient was last seen in primary care on 08/15/2023 by Bevely Doffing, FNP.  Called Nurse Triage reporting Cough.  Symptoms began several days ago.  Interventions attempted: Other: Allbuterol.  Symptoms are: unchanged.  Triage Disposition: see within 24 hours Patient/caregiver understands and will follow disposition?: yes        Copied from CRM #[id number]. Topic: Clinical - Red Word Triage >> Oct 30, 2023  8:06 AM Rosaria BRAVO wrote: Red Word that prompted transfer to Nurse Triage: Congestion, cough, difficulty breathing. Cannot breathe when she is laying down. Reason for Disposition  Wheezing is present  Answer Assessment - Initial Assessment Questions 1. ONSET: When did the cough begin?      4 days  2. SEVERITY: How bad is the cough today?      Difficulty breathing when laying flat  3. SPUTUM: Describe the color of your sputum (e.g., none, dry cough; clear, white, yellow, green)     no 4. HEMOPTYSIS: Are you coughing up any blood? If Yes, ask: How much? (e.g., flecks, streaks, tablespoons, etc.)     N/a 5. DIFFICULTY BREATHING: Are you having difficulty breathing? If Yes, ask: How bad is it? (e.g., mild, moderate, severe)      Mild  6. FEVER: Do you have a fever? If Yes, ask: What is your temperature, how was it measured, and when did it start?     Low grade 7. CARDIAC HISTORY: Do you have any history of heart disease? (e.g., heart attack, congestive heart failure)      no 8. LUNG HISTORY: Do you have any history of lung disease?  (e.g., pulmonary embolus, asthma, emphysema)     no 9. PE RISK FACTORS: Do you have a history of blood clots? (or: recent major surgery, recent prolonged travel, bedridden)     N/a 10. OTHER SYMPTOMS: Do you have any other symptoms? (e.g., runny nose, wheezing, chest pain)       Chest congestion , wheezing  11. PREGNANCY: Is there any chance you are pregnant? When  was your last menstrual period?       N/a 12. TRAVEL: Have you traveled out of the country in the last month? (e.g., travel history, exposures)       N/a  Protocols used: Cough - Acute Non-Productive-A-AH

## 2023-10-30 NOTE — Telephone Encounter (Signed)
 Patient scheduled.

## 2023-10-31 ENCOUNTER — Ambulatory Visit: Payer: Self-pay

## 2023-11-01 DIAGNOSIS — M79675 Pain in left toe(s): Secondary | ICD-10-CM | POA: Diagnosis not present

## 2023-11-01 DIAGNOSIS — M79671 Pain in right foot: Secondary | ICD-10-CM | POA: Diagnosis not present

## 2023-11-01 DIAGNOSIS — M79672 Pain in left foot: Secondary | ICD-10-CM | POA: Diagnosis not present

## 2023-11-01 DIAGNOSIS — M79674 Pain in right toe(s): Secondary | ICD-10-CM | POA: Diagnosis not present

## 2023-11-01 DIAGNOSIS — I739 Peripheral vascular disease, unspecified: Secondary | ICD-10-CM | POA: Diagnosis not present

## 2023-11-01 DIAGNOSIS — L110 Acquired keratosis follicularis: Secondary | ICD-10-CM | POA: Diagnosis not present

## 2023-11-02 ENCOUNTER — Ambulatory Visit (INDEPENDENT_AMBULATORY_CARE_PROVIDER_SITE_OTHER)

## 2023-11-02 VITALS — BP 124/74 | HR 60 | Ht 67.0 in | Wt 212.0 lb

## 2023-11-02 DIAGNOSIS — J0100 Acute maxillary sinusitis, unspecified: Secondary | ICD-10-CM | POA: Diagnosis not present

## 2023-11-02 MED ORDER — PROMETHAZINE-DM 6.25-15 MG/5ML PO SYRP
5.0000 mL | ORAL_SOLUTION | Freq: Four times a day (QID) | ORAL | 0 refills | Status: AC | PRN
Start: 2023-11-02 — End: ?

## 2023-11-02 MED ORDER — AZITHROMYCIN 250 MG PO TABS: ORAL_TABLET | ORAL | 0 refills | Status: AC

## 2023-11-02 NOTE — Progress Notes (Signed)
 Established Patient Office Visit  Subjective   Patient ID: Tonya Nelson, female    DOB: 1955-04-16  Age: 68 y.o. MRN: [id number]  Chief Complaint  Patient presents with   URI    For 8 days, cough, congested, wheezing, body aches and chills. At home covid test negative     HPI Discussed the use of AI scribe software for clinical note transcription with the patient, who gave verbal consent to proceed.  History of Present Illness   Tonya Nelson is a 68 year old female who presents with a persistent cough and concerns about an infected toenail.  Cough - Persistent for 8 days - Worsens when lying flat - Associated with a sensation of rattling in the chest - No recent exposure to sick individuals  Periungual erythema and suspected infection - Redness around toenail appeared last night - Area appears improved today - Regular toenail trimming - Redness noticed after recent toenail trimming visit  Right hand pain and triggering - Pain and stinging sensation in right hand - Affects one finger, which sometimes locks and requires manual manipulation to straighten - Symptoms have been occurring recently  Hypertension - Blood pressure managed with medication - Antihypertensive regimen is effective  Drug allergy - Allergy to penicillin      Patient Active Problem List   Diagnosis Date Noted   Upper respiratory infection 01/24/2023   Acute viral sinusitis 04/08/2022   Prediabetes 11/24/2021   Asthma 11/24/2021   Encounter to establish care 11/24/2021   LFTs abnormal 01/20/2021   Wheezing 07/30/2020   Obesity (BMI 30.0-34.9) 02/04/2020   Encounter for general adult medical examination with abnormal findings 01/14/2020   Elevated LDL cholesterol level 01/14/2020   Other insomnia 12/12/2019   Screening for colorectal cancer 06/04/2019   Hemorrhoids 06/04/2019   Elevated glucose 04/30/2019   Essential hypertension 04/18/2019   Osteoporosis 04/18/2019    ROS     Objective:     BP 124/74   Pulse 60   Ht 5' 7 (1.702 m)   Wt 212 lb (96.2 kg)   SpO2 96%   BMI 33.20 kg/m  BP Readings from Last 3 Encounters:  11/02/23 124/74  08/15/23 128/67  02/14/23 128/60   Wt Readings from Last 3 Encounters:  11/02/23 212 lb (96.2 kg)  08/15/23 216 lb 1.3 oz (98 kg)  02/28/23 228 lb (103.4 kg)     Physical Exam Vitals and nursing note reviewed.  Constitutional:      Appearance: Normal appearance.  HENT:     Head: Normocephalic.     Right Ear: Tympanic membrane, ear canal and external ear normal.     Left Ear: Tympanic membrane, ear canal and external ear normal.     Nose: Congestion and rhinorrhea present.     Right Sinus: Maxillary sinus tenderness present. No frontal sinus tenderness.     Left Sinus: Maxillary sinus tenderness present. No frontal sinus tenderness.     Mouth/Throat:     Pharynx: Oropharynx is clear. Uvula midline.  Eyes:     Extraocular Movements: Extraocular movements intact.     Pupils: Pupils are equal, round, and reactive to light.  Cardiovascular:     Rate and Rhythm: Normal rate and regular rhythm.  Pulmonary:     Effort: Pulmonary effort is normal.     Breath sounds: Normal breath sounds.  Musculoskeletal:     Cervical back: Normal range of motion and neck supple.  Neurological:     Mental Status:  She is alert and oriented to person, place, and time.  Psychiatric:        Mood and Affect: Mood normal.        Thought Content: Thought content normal.    No results found for any visits on 11/02/23.    The 10-year ASCVD risk score (Arnett DK, et al., 2019) is: 11.6%    Assessment & Plan:   Problem List Items Addressed This Visit   None Visit Diagnoses       Acute non-recurrent maxillary sinusitis    -  Primary   Add oral atbx and cough medication.  recommend increased fluid intake. recommend f/u if no improvement in 5-7 days.   Relevant Medications   azithromycin  (ZITHROMAX ) 250 MG tablet    promethazine -dextromethorphan (PROMETHAZINE -DM) 6.25-15 MG/5ML syrup       No follow-ups on file.    Leita Longs, FNP

## 2024-02-16 ENCOUNTER — Ambulatory Visit

## 2024-02-16 ENCOUNTER — Ambulatory Visit: Payer: Self-pay

## 2024-02-16 VITALS — BP 135/80 | HR 56 | Ht 67.0 in | Wt 211.1 lb

## 2024-02-16 DIAGNOSIS — Z0001 Encounter for general adult medical examination with abnormal findings: Secondary | ICD-10-CM

## 2024-02-16 DIAGNOSIS — I10 Essential (primary) hypertension: Secondary | ICD-10-CM

## 2024-02-16 DIAGNOSIS — Z1211 Encounter for screening for malignant neoplasm of colon: Secondary | ICD-10-CM | POA: Diagnosis not present

## 2024-02-16 DIAGNOSIS — Z0000 Encounter for general adult medical examination without abnormal findings: Secondary | ICD-10-CM

## 2024-02-16 DIAGNOSIS — E559 Vitamin D deficiency, unspecified: Secondary | ICD-10-CM

## 2024-02-16 MED ORDER — BENZONATATE 200 MG PO CAPS: 200.0000 mg | ORAL_CAPSULE | Freq: Two times a day (BID) | ORAL | 5 refills | Status: AC | PRN

## 2024-02-16 MED ORDER — BISOPROLOL-HYDROCHLOROTHIAZIDE 5-6.25 MG PO TABS: 1.0000 | ORAL_TABLET | Freq: Every day | ORAL | 3 refills | Status: AC

## 2024-02-16 NOTE — Progress Notes (Signed)
 "  Established Patient Office Visit  Subjective   Patient ID: Tonya Nelson, female    DOB: [birth year]  Age: 69 y.o. MRN: [id number]  Chief Complaint  Patient presents with   Annual Exam    Pt has questions if she is due for lab work, also pt has developed a cough no fever just wants something to help with the cough     HPI Discussed the use of AI scribe software for clinical note transcription with the patient, who gave verbal consent to proceed.  History of Present Illness    Tonya Nelson is a 69 year old female who presents for an annual physical exam and evaluation of eczema and a dry cough.  Eczematous dermatitis - Annual recurrence of eczema, typically between fingers - Current outbreak located on her side - No topical treatments applied yet - Seeking recommendations for appropriate creams  Chronic dry cough - Persistent, non-productive cough described as a 'tickle' - No associated fever; afebrile on home temperature checks - History of similar cough previously treated with Tigan Pearl  Hypertension and diuretic use - Takes antihypertensive medication and Lasix  as needed - No recent ankle swelling - Gradual weight loss attributed to increased physical activity  Preventive health and physical activity - Engages in regular walking for exercise - Emphasizes importance of staying active and preventing falls  Laboratory monitoring - Last laboratory studies completed December 2024 - Not fasting today; plans to return for updated lab work     Patient Active Problem List   Diagnosis Date Noted   Vitamin D  deficiency 02/16/2024   Upper respiratory infection 01/24/2023   Acute viral sinusitis 04/08/2022   Prediabetes 11/24/2021   Asthma 11/24/2021   LFTs abnormal 01/20/2021   Wheezing 07/30/2020   Obesity (BMI 30.0-34.9) 02/04/2020   Encounter for general adult medical examination with abnormal findings 01/14/2020   Elevated LDL cholesterol level 01/14/2020   Other  insomnia 12/12/2019   Screening for colorectal cancer 06/04/2019   Hemorrhoids 06/04/2019   Elevated glucose 04/30/2019   Essential hypertension 04/18/2019   Osteoporosis 04/18/2019    ROS    Objective:     BP 135/80   Pulse (!) 56   Ht 5' 7 (1.702 m)   Wt 211 lb 1.9 oz (95.8 kg)   SpO2 97%   BMI 33.07 kg/m  BP Readings from Last 3 Encounters:  02/16/24 135/80  11/02/23 124/74  08/15/23 128/67   Wt Readings from Last 3 Encounters:  02/16/24 211 lb 1.9 oz (95.8 kg)  11/02/23 212 lb (96.2 kg)  08/15/23 216 lb 1.3 oz (98 kg)      Physical Exam Vitals and nursing note reviewed.  Constitutional:      Appearance: Normal appearance. She is obese.  HENT:     Head: Normocephalic.     Right Ear: Tympanic membrane, ear canal and external ear normal.     Left Ear: Tympanic membrane, ear canal and external ear normal.     Nose: Nose normal.     Mouth/Throat:     Mouth: Mucous membranes are moist.     Pharynx: Oropharynx is clear.  Eyes:     Extraocular Movements: Extraocular movements intact.     Conjunctiva/sclera: Conjunctivae normal.     Pupils: Pupils are equal, round, and reactive to light.  Cardiovascular:     Rate and Rhythm: Normal rate and regular rhythm.  Pulmonary:     Effort: Pulmonary effort is normal.     Breath  sounds: Normal breath sounds.  Abdominal:     General: Bowel sounds are normal.     Palpations: Abdomen is soft.  Musculoskeletal:        General: Normal range of motion.     Cervical back: Normal range of motion and neck supple.  Skin:    General: Skin is warm and dry.  Neurological:     Mental Status: She is alert and oriented to person, place, and time.  Psychiatric:        Mood and Affect: Mood normal.        Thought Content: Thought content normal.      No results found for any visits on 02/16/24.  Last CBC Lab Results  Component Value Date   WBC 7.8 02/14/2023   HGB 13.5 02/14/2023   HCT 41.5 02/14/2023   MCV 88 02/14/2023    MCH 28.6 02/14/2023   RDW 12.9 02/14/2023   PLT 253 02/14/2023   Last metabolic panel Lab Results  Component Value Date   GLUCOSE 90 02/14/2023   NA 140 02/14/2023   K 3.7 02/14/2023   CL 102 02/14/2023   CO2 22 02/14/2023   BUN 13 02/14/2023   CREATININE 0.84 02/14/2023   EGFR 76 02/14/2023   CALCIUM 9.6 02/14/2023   PROT 7.2 02/14/2023   ALBUMIN 4.5 02/14/2023   LABGLOB 2.7 02/14/2023   AGRATIO 1.5 11/18/2021   BILITOT 0.4 02/14/2023   ALKPHOS 48 02/14/2023   AST 32 02/14/2023   ALT 27 02/14/2023   Last lipids Lab Results  Component Value Date   CHOL 180 02/14/2023   HDL 33 (L) 02/14/2023   LDLCALC 97 02/14/2023   TRIG 297 (H) 02/14/2023   CHOLHDL 5.5 (H) 02/14/2023   Last hemoglobin A1c Lab Results  Component Value Date   HGBA1C 5.6 02/14/2023   Last thyroid  functions Lab Results  Component Value Date   TSH 1.640 02/14/2023   FREET4 1.10 02/14/2023   Last vitamin D  Lab Results  Component Value Date   VD25OH 25.6 (L) 02/14/2023   Last vitamin B12 and Folate Lab Results  Component Value Date   VITAMINB12 248 02/14/2023   FOLATE 5.6 02/14/2023     The 10-year ASCVD risk score (Arnett DK, et al., 2019) is: 13.6%    Assessment & Plan:   Problem List Items Addressed This Visit       Cardiovascular and Mediastinum   Essential hypertension   Remains adequately controlled with bisoprolol -HCTZ 5-6.25 mg daily.  No medication changes are indicated today.  Continue to work on weight loss with a healthy low sodium diet and regular exercise.  Recommend follow-up in 6 months or sooner if needed.      Relevant Medications   bisoprolol -hydrochlorothiazide  (ZIAC ) 5-6.25 MG tablet     Other   Vitamin D  deficiency   Recheck vitamin D  levels.       Relevant Orders   Vitamin D  (25 hydroxy)   Other Visit Diagnoses       Encounter for preventative adult health care examination    -  Primary   Unremarkable exam.  She will return for fasting labs next  week.  Continue with weight loss efforts.   Relevant Orders   CMP14+EGFR   Lipid Profile   HgB A1c   TSH + free T4   CBC     Screening for colon cancer       Obtain Cologuard for colon cancer screening.   Relevant Orders   Cologuard  No follow-ups on file.    Leita Longs, FNP  "

## 2024-02-16 NOTE — Assessment & Plan Note (Signed)
 Remains adequately controlled with bisoprolol -HCTZ 5-6.25 mg daily.  No medication changes are indicated today.  Continue to work on weight loss with a healthy low sodium diet and regular exercise.  Recommend follow-up in 6 months or sooner if needed.

## 2024-02-16 NOTE — Assessment & Plan Note (Signed)
 Recheck vitamin D  levels

## 2024-03-04 ENCOUNTER — Ambulatory Visit: Payer: Medicare Other

## 2024-08-15 ENCOUNTER — Ambulatory Visit: Payer: Self-pay
